# Patient Record
Sex: Female | Born: 1935 | Race: White | Hispanic: No | Smoking: Never smoker
Health system: Southern US, Community
[De-identification: ages and names within clinical notes are randomized; demographics above are authoritative.]

## PROBLEM LIST (undated history)

## (undated) DIAGNOSIS — C801 Malignant (primary) neoplasm, unspecified: Secondary | ICD-10-CM

## (undated) DIAGNOSIS — M1990 Unspecified osteoarthritis, unspecified site: Secondary | ICD-10-CM

## (undated) DIAGNOSIS — J45909 Unspecified asthma, uncomplicated: Secondary | ICD-10-CM

## (undated) DIAGNOSIS — I10 Essential (primary) hypertension: Secondary | ICD-10-CM

## (undated) HISTORY — PX: TONSILLECTOMY AND ADENOIDECTOMY: SUR1326

## (undated) HISTORY — PX: COLONOSCOPY: SHX174

## (undated) HISTORY — PX: APPENDECTOMY: SHX54

---

## 1968-07-07 DIAGNOSIS — C801 Malignant (primary) neoplasm, unspecified: Secondary | ICD-10-CM

## 1968-07-07 HISTORY — DX: Malignant (primary) neoplasm, unspecified: C80.1

## 2005-02-11 ENCOUNTER — Ambulatory Visit: Payer: Self-pay | Admitting: Internal Medicine

## 2006-05-12 ENCOUNTER — Ambulatory Visit: Payer: Self-pay | Admitting: Internal Medicine

## 2007-07-22 ENCOUNTER — Ambulatory Visit: Payer: Self-pay | Admitting: Internal Medicine

## 2007-07-30 ENCOUNTER — Ambulatory Visit: Payer: Self-pay | Admitting: Internal Medicine

## 2008-07-24 ENCOUNTER — Ambulatory Visit: Payer: Self-pay | Admitting: Internal Medicine

## 2009-07-01 ENCOUNTER — Emergency Department: Payer: Self-pay | Admitting: Emergency Medicine

## 2009-08-21 ENCOUNTER — Ambulatory Visit: Payer: Self-pay | Admitting: Internal Medicine

## 2010-08-27 ENCOUNTER — Ambulatory Visit: Payer: Self-pay | Admitting: Internal Medicine

## 2011-10-08 ENCOUNTER — Ambulatory Visit: Payer: Self-pay | Admitting: Internal Medicine

## 2012-11-08 ENCOUNTER — Ambulatory Visit: Payer: Self-pay | Admitting: Internal Medicine

## 2012-11-09 ENCOUNTER — Ambulatory Visit: Payer: Self-pay | Admitting: Internal Medicine

## 2013-01-06 ENCOUNTER — Ambulatory Visit: Payer: Self-pay | Admitting: Orthopedic Surgery

## 2013-06-20 ENCOUNTER — Emergency Department: Payer: Self-pay | Admitting: Emergency Medicine

## 2013-06-20 LAB — URINALYSIS, COMPLETE
Bacteria: NONE SEEN
Blood: NEGATIVE
Glucose,UR: NEGATIVE mg/dL (ref 0–75)
Hyaline Cast: 1
Nitrite: NEGATIVE
RBC,UR: 1 /HPF (ref 0–5)
Specific Gravity: 1.015 (ref 1.003–1.030)

## 2013-06-20 LAB — BASIC METABOLIC PANEL
Anion Gap: 3 — ABNORMAL LOW (ref 7–16)
BUN: 17 mg/dL (ref 7–18)
Chloride: 104 mmol/L (ref 98–107)
Co2: 29 mmol/L (ref 21–32)
Creatinine: 0.76 mg/dL (ref 0.60–1.30)
EGFR (Non-African Amer.): >60
Glucose: 107 mg/dL — ABNORMAL HIGH (ref 65–99)
Sodium: 136 mmol/L (ref 136–145)

## 2013-06-20 LAB — CBC WITH DIFFERENTIAL/PLATELET
Basophil %: 0.7 %
Eosinophil #: 0 10*3/uL (ref 0.0–0.7)
Eosinophil %: 0.2 %
HCT: 44.5 % (ref 35.0–47.0)
Lymphocyte #: 3 10*3/uL (ref 1.0–3.6)
Lymphocyte %: 23.7 %
MCH: 29.8 pg (ref 26.0–34.0)
MCV: 92 fL (ref 80–100)
Monocyte #: 0.5 x10 3/mm (ref 0.2–0.9)
Monocyte %: 4.3 %
Neutrophil #: 8.9 10*3/uL — ABNORMAL HIGH (ref 1.4–6.5)
Neutrophil %: 71.1 %
Platelet: 204 10*3/uL (ref 150–440)
RBC: 4.84 10*6/uL (ref 3.80–5.20)
RDW: 14.1 % (ref 11.5–14.5)
WBC: 12.5 10*3/uL — ABNORMAL HIGH (ref 3.6–11.0)

## 2013-11-11 ENCOUNTER — Ambulatory Visit: Payer: Self-pay | Admitting: Internal Medicine

## 2013-11-21 ENCOUNTER — Ambulatory Visit: Payer: Self-pay | Admitting: Internal Medicine

## 2014-10-12 ENCOUNTER — Ambulatory Visit
Admit: 2014-10-12 | Disposition: A | Discharge status: Home / Self Care | Payer: Self-pay | Attending: Podiatry | Admitting: Podiatry

## 2014-10-12 HISTORY — PX: HAMMER TOE SURGERY: SHX385

## 2014-10-20 ENCOUNTER — Other Ambulatory Visit: Payer: Self-pay | Admitting: Internal Medicine

## 2014-10-20 DIAGNOSIS — Z1231 Encounter for screening mammogram for malignant neoplasm of breast: Secondary | ICD-10-CM

## 2014-10-27 ENCOUNTER — Ambulatory Visit
Admit: 2014-10-27 | Disposition: A | Discharge status: Home / Self Care | Payer: Self-pay | Attending: Podiatry | Admitting: Podiatry

## 2014-10-27 HISTORY — PX: ORIF METATARSAL FRACTURE: SUR942

## 2014-11-13 ENCOUNTER — Other Ambulatory Visit: Payer: Self-pay | Admitting: Internal Medicine

## 2014-11-13 ENCOUNTER — Ambulatory Visit
Admission: RE | Admit: 2014-11-13 | Discharge: 2014-11-13 | Disposition: A | Discharge status: Home / Self Care | Payer: Medicare Other | Source: Ambulatory Visit | Attending: Internal Medicine | Admitting: Internal Medicine

## 2014-11-13 DIAGNOSIS — Z1231 Encounter for screening mammogram for malignant neoplasm of breast: Secondary | ICD-10-CM

## 2014-11-13 HISTORY — DX: Malignant (primary) neoplasm, unspecified: C80.1

## 2015-10-17 ENCOUNTER — Other Ambulatory Visit: Payer: Self-pay | Admitting: Internal Medicine

## 2015-10-17 DIAGNOSIS — Z1231 Encounter for screening mammogram for malignant neoplasm of breast: Secondary | ICD-10-CM

## 2015-11-28 ENCOUNTER — Ambulatory Visit
Admission: RE | Admit: 2015-11-28 | Discharge: 2015-11-28 | Disposition: A | Discharge status: Home / Self Care | Payer: Medicare Other | Source: Ambulatory Visit | Attending: Internal Medicine | Admitting: Internal Medicine

## 2015-11-28 ENCOUNTER — Other Ambulatory Visit: Payer: Self-pay | Admitting: Internal Medicine

## 2015-11-28 DIAGNOSIS — Z1231 Encounter for screening mammogram for malignant neoplasm of breast: Secondary | ICD-10-CM

## 2016-10-20 ENCOUNTER — Other Ambulatory Visit: Payer: Self-pay | Admitting: Internal Medicine

## 2016-10-20 DIAGNOSIS — Z1231 Encounter for screening mammogram for malignant neoplasm of breast: Secondary | ICD-10-CM

## 2016-11-28 ENCOUNTER — Ambulatory Visit
Admission: RE | Admit: 2016-11-28 | Discharge: 2016-11-28 | Disposition: A | Discharge status: Home / Self Care | Payer: Medicare Other | Source: Ambulatory Visit | Attending: Internal Medicine | Admitting: Internal Medicine

## 2016-11-28 DIAGNOSIS — Z1231 Encounter for screening mammogram for malignant neoplasm of breast: Secondary | ICD-10-CM | POA: Insufficient documentation

## 2017-04-02 ENCOUNTER — Encounter: Payer: Self-pay | Admitting: Emergency Medicine

## 2017-04-02 ENCOUNTER — Emergency Department: Payer: Medicare Other

## 2017-04-02 ENCOUNTER — Observation Stay
Admission: EM | Admit: 2017-04-02 | Discharge: 2017-04-03 | Disposition: A | Discharge status: Home / Self Care | Payer: Medicare Other | Attending: Internal Medicine | Admitting: Internal Medicine

## 2017-04-02 ENCOUNTER — Observation Stay
Admit: 2017-04-02 | Discharge: 2017-04-02 | Disposition: A | Discharge status: Home / Self Care | Payer: Medicare Other | Attending: Internal Medicine | Admitting: Internal Medicine

## 2017-04-02 DIAGNOSIS — J45901 Unspecified asthma with (acute) exacerbation: Secondary | ICD-10-CM | POA: Diagnosis not present

## 2017-04-02 DIAGNOSIS — J9601 Acute respiratory failure with hypoxia: Secondary | ICD-10-CM | POA: Diagnosis not present

## 2017-04-02 DIAGNOSIS — R000 Tachycardia, unspecified: Secondary | ICD-10-CM

## 2017-04-02 DIAGNOSIS — Z7982 Long term (current) use of aspirin: Secondary | ICD-10-CM | POA: Diagnosis not present

## 2017-04-02 DIAGNOSIS — R0602 Shortness of breath: Secondary | ICD-10-CM

## 2017-04-02 DIAGNOSIS — Z79899 Other long term (current) drug therapy: Secondary | ICD-10-CM | POA: Diagnosis not present

## 2017-04-02 DIAGNOSIS — R0682 Tachypnea, not elsewhere classified: Secondary | ICD-10-CM | POA: Diagnosis not present

## 2017-04-02 DIAGNOSIS — R062 Wheezing: Secondary | ICD-10-CM

## 2017-04-02 DIAGNOSIS — R079 Chest pain, unspecified: Secondary | ICD-10-CM

## 2017-04-02 DIAGNOSIS — I209 Angina pectoris, unspecified: Principal | ICD-10-CM | POA: Insufficient documentation

## 2017-04-02 DIAGNOSIS — R0902 Hypoxemia: Secondary | ICD-10-CM

## 2017-04-02 LAB — TROPONIN I: Troponin I: <0.03 ng/mL (ref <0.03)

## 2017-04-02 LAB — ECHOCARDIOGRAM COMPLETE
Height: 66 in
WEIGHTICAEL: 2880 [oz_av]

## 2017-04-02 LAB — HEMOGLOBIN A1C
HEMOGLOBIN A1C: 5.9 % — AB (ref 4.8–5.6)
Mean Plasma Glucose: 122.63 mg/dL

## 2017-04-02 LAB — CBC
HCT: 42.5 % (ref 35.0–47.0)
Hemoglobin: 14.3 g/dL (ref 12.0–16.0)
MCH: 30.3 pg (ref 26.0–34.0)
MCHC: 33.7 g/dL (ref 32.0–36.0)
MCV: 90 fL (ref 80.0–100.0)
PLATELETS: 183 10*3/uL (ref 150–440)
RBC: 4.72 MIL/uL (ref 3.80–5.20)
RDW: 13.6 % (ref 11.5–14.5)
WBC: 13.2 10*3/uL — AB (ref 3.6–11.0)

## 2017-04-02 LAB — LIPID PANEL
CHOLESTEROL: 232 mg/dL — AB (ref 0–200)
HDL: 64 mg/dL (ref >40)
LDL Cholesterol: 155 mg/dL — ABNORMAL HIGH (ref 0–99)
TRIGLYCERIDES: 67 mg/dL (ref <150)
Total CHOL/HDL Ratio: 3.6 RATIO
VLDL: 13 mg/dL (ref 0–40)

## 2017-04-02 LAB — BRAIN NATRIURETIC PEPTIDE: B Natriuretic Peptide: 50 pg/mL (ref 0.0–100.0)

## 2017-04-02 LAB — BASIC METABOLIC PANEL
Anion gap: 7 (ref 5–15)
BUN: 13 mg/dL (ref 6–20)
CO2: 29 mmol/L (ref 22–32)
CREATININE: 0.66 mg/dL (ref 0.44–1.00)
Calcium: 9 mg/dL (ref 8.9–10.3)
Chloride: 102 mmol/L (ref 101–111)
GFR calc non Af Amer: >60 mL/min (ref >60)
Glucose, Bld: 128 mg/dL — ABNORMAL HIGH (ref 65–99)
Potassium: 4.6 mmol/L (ref 3.5–5.1)
SODIUM: 138 mmol/L (ref 135–145)

## 2017-04-02 LAB — PROTIME-INR
INR: 0.99
Prothrombin Time: 13 seconds (ref 11.4–15.2)

## 2017-04-02 LAB — APTT

## 2017-04-02 MED ORDER — IBUPROFEN 400 MG PO TABS
400.0000 mg | ORAL_TABLET | Freq: Four times a day (QID) | ORAL | Status: DC | PRN
  Administered 2017-04-02: 400 mg via ORAL
  Filled 2017-04-02: qty 1

## 2017-04-02 MED ORDER — ALPRAZOLAM 0.5 MG PO TABS: 0.2500 mg | ORAL_TABLET | Freq: Every day | ORAL | Status: DC

## 2017-04-02 MED ORDER — IPRATROPIUM-ALBUTEROL 0.5-2.5 (3) MG/3ML IN SOLN
3.0000 mL | Freq: Once | RESPIRATORY_TRACT | Status: AC
  Administered 2017-04-02: 3 mL via RESPIRATORY_TRACT
  Filled 2017-04-02: qty 3

## 2017-04-02 MED ORDER — HEPARIN SODIUM (PORCINE) 5000 UNIT/ML IJ SOLN
5000.0000 [IU] | Freq: Three times a day (TID) | INTRAMUSCULAR | Status: DC
  Administered 2017-04-02 – 2017-04-03 (×3): 5000 [IU] via SUBCUTANEOUS
  Filled 2017-04-02 (×3): qty 1

## 2017-04-02 MED ORDER — DOCUSATE SODIUM 100 MG PO CAPS: 100.0000 mg | ORAL_CAPSULE | Freq: Two times a day (BID) | ORAL | Status: DC | PRN

## 2017-04-02 MED ORDER — ASPIRIN 81 MG PO CHEW
324.0000 mg | CHEWABLE_TABLET | Freq: Once | ORAL | Status: DC
  Filled 2017-04-02: qty 4

## 2017-04-02 MED ORDER — METHYLPREDNISOLONE SODIUM SUCC 125 MG IJ SOLR
125.0000 mg | Freq: Once | INTRAMUSCULAR | Status: AC
  Administered 2017-04-02: 125 mg via INTRAVENOUS
  Filled 2017-04-02: qty 2

## 2017-04-02 MED ORDER — ADULT MULTIVITAMIN W/MINERALS CH
1.0000 | ORAL_TABLET | Freq: Every day | ORAL | Status: DC
  Administered 2017-04-02 – 2017-04-03 (×2): 1 via ORAL
  Filled 2017-04-02 (×2): qty 1

## 2017-04-02 MED ORDER — INFLUENZA VAC SPLIT HIGH-DOSE 0.5 ML IM SUSY
0.5000 mL | PREFILLED_SYRINGE | INTRAMUSCULAR | Status: DC
  Filled 2017-04-02: qty 0.5

## 2017-04-02 MED ORDER — LORATADINE 10 MG PO TABS: 10.0000 mg | ORAL_TABLET | Freq: Every day | ORAL | Status: DC | PRN

## 2017-04-02 MED ORDER — ASPIRIN EC 81 MG PO TBEC
81.0000 mg | DELAYED_RELEASE_TABLET | ORAL | Status: DC
  Administered 2017-04-03: 81 mg via ORAL

## 2017-04-02 MED ORDER — NITROGLYCERIN 0.4 MG SL SUBL: 0.4000 mg | SUBLINGUAL_TABLET | SUBLINGUAL | Status: DC | PRN

## 2017-04-02 MED ORDER — IOPAMIDOL (ISOVUE-370) INJECTION 76%
75.0000 mL | Freq: Once | INTRAVENOUS | Status: AC | PRN
  Administered 2017-04-02: 75 mL via INTRAVENOUS

## 2017-04-02 MED ORDER — IPRATROPIUM-ALBUTEROL 0.5-2.5 (3) MG/3ML IN SOLN
3.0000 mL | Freq: Four times a day (QID) | RESPIRATORY_TRACT | Status: DC | PRN
  Administered 2017-04-02: 3 mL via RESPIRATORY_TRACT
  Filled 2017-04-02: qty 3

## 2017-04-02 MED ORDER — ALPRAZOLAM 0.25 MG PO TABS: 0.2500 mg | ORAL_TABLET | Freq: Every day | ORAL | Status: DC | PRN

## 2017-04-02 MED ORDER — MONTELUKAST SODIUM 10 MG PO TABS
10.0000 mg | ORAL_TABLET | Freq: Every day | ORAL | Status: DC
  Administered 2017-04-02: 10 mg via ORAL
  Filled 2017-04-02: qty 1

## 2017-04-02 MED ORDER — ZOLPIDEM TARTRATE 5 MG PO TABS
5.0000 mg | ORAL_TABLET | Freq: Once | ORAL | Status: AC
  Administered 2017-04-02: 5 mg via ORAL
  Filled 2017-04-02: qty 1

## 2017-04-02 MED ORDER — CALCIUM CARBONATE-VITAMIN D 500-200 MG-UNIT PO TABS
1.0000 | ORAL_TABLET | Freq: Every day | ORAL | Status: DC
  Administered 2017-04-02 – 2017-04-03 (×2): 1 via ORAL
  Filled 2017-04-02 (×2): qty 1

## 2017-04-02 MED ORDER — SODIUM CHLORIDE 0.9 % IV BOLUS (SEPSIS)
500.0000 mL | Freq: Once | INTRAVENOUS | Status: AC
  Administered 2017-04-02: 500 mL via INTRAVENOUS

## 2017-04-02 NOTE — Progress Notes (Signed)
*  PRELIMINARY RESULTS* Echocardiogram 2D Echocardiogram has been performed.  Erika Finley 04/02/2017, 1:12 PM

## 2017-04-02 NOTE — Progress Notes (Signed)
Patient alert and oriented, vss, no complaints of pain, NSR on monitor.  troponins negative x3.  No chest pain during shift.  Continue to monitor.

## 2017-04-02 NOTE — Progress Notes (Signed)
Pt complaining of a headache, no PRN's ordered. Spoke with Dr. Anselm Jungling, he is to put in orders for ibuprofen.

## 2017-04-02 NOTE — ED Triage Notes (Signed)
Pt reports she woke this AM with left sided chest pain that felt "sharp." Pt is SOB in triage. O2 sat 89%, RA. Pt has hx of asthma only. Pt denies N/V.

## 2017-04-02 NOTE — H&P (Signed)
[facility] NAME: Erika Finley    MR#:  [id number]  DATE OF BIRTH:  [birth year]  DATE OF ADMISSION:  04/02/2017  PRIMARY CARE PHYSICIAN: Rusty Aus, MD   REQUESTING/REFERRING PHYSICIAN: norman  CHIEF COMPLAINT:   Chief Complaint  Patient presents with  . Chest Pain    HISTORY OF PRESENT ILLNESS: Erika Finley  is a 81 y.o. female with a known history of Asthma, fairly healthy and active daily life. Was going through her late husbands papers yesterday with her son. In Night she woke up at 1 am with tightness in her chest- though it may be muscle pain and tried ice pack and then Hot pad, but it did not help, pain was retrosternal and pressure like, it was going to her left shoulder and her neck and jaw. She had wheezing - so decided to come to ER. Feels relieved after getting nebs treatment. 1st troponin and EKG is negative. Had some hypoxia on arrival.  PAST MEDICAL HISTORY:   Past Medical History:  Diagnosis Date  . Asthma   . Cancer ([place]) 1970   skin    PAST SURGICAL HISTORY: History reviewed. No pertinent surgical history.  SOCIAL HISTORY:  Social History  Substance Use Topics  . Smoking status: Never Smoker  . Smokeless tobacco: Never Used  . Alcohol use 1.2 oz/week    2 Glasses of wine per week     Comment: 1/4 glass before dinner    FAMILY HISTORY:  Family History  Problem Relation Age of Onset  . Valvular heart disease Brother   . Breast cancer Neg Hx     DRUG ALLERGIES:  Allergies  Allergen Reactions  . Penicillins Rash    Has patient had a PCN reaction causing immediate rash, facial/tongue/throat swelling, SOB or lightheadedness with hypotension: Unknown Has patient had a PCN reaction causing severe rash involving mucus membranes or skin necrosis: Unknown Has patient had a PCN reaction that required hospitalization: Unknown Has patient had a PCN reaction occurring within the last 10 years:  Unknown If all of the above answers are "NO", then may proceed with Cephalosporin use.     REVIEW OF SYSTEMS:   CONSTITUTIONAL: No fever, fatigue or weakness.  EYES: No blurred or double vision.  EARS, NOSE, AND THROAT: No tinnitus or ear pain.  RESPIRATORY: No cough, shortness of breath, wheezing or hemoptysis.  CARDIOVASCULAR: positive for chest pain,no orthopnea, edema.  GASTROINTESTINAL: No nausea, vomiting, diarrhea or abdominal pain.  GENITOURINARY: No dysuria, hematuria.  ENDOCRINE: No polyuria, nocturia,  HEMATOLOGY: No anemia, easy bruising or bleeding SKIN: No rash or lesion. MUSCULOSKELETAL: No joint pain or arthritis.   NEUROLOGIC: No tingling, numbness, weakness.  PSYCHIATRY: No anxiety or depression.   MEDICATIONS AT HOME:  Prior to Admission medications   Medication Sig Start Date End Date Taking? Authorizing Provider  ALPRAZolam (XANAX) 0.25 MG tablet Take 0.25 mg by mouth daily. 03/30/17  Yes [provider]  aspirin EC 81 MG tablet Take 81 mg by mouth 3 (three) times a week.   Yes [provider]  Calcium-Vitamin D 600-200 MG-UNIT tablet Take 1 tablet by mouth daily.   Yes [provider]  fexofenadine (ALLEGRA) 180 MG tablet Take 180 mg by mouth daily. 10/17/16  Yes [provider]  montelukast (SINGULAIR) 10 MG tablet Take 10 mg by mouth at bedtime. 02/27/17  Yes [provider]  Multiple Vitamin (MULTI-VITAMINS) TABS Take 1 tablet by mouth  daily.   Yes [provider]      PHYSICAL EXAMINATION:   VITAL SIGNS: Blood pressure (!) 153/76, pulse (!) 106, temperature 98 F (36.7 C), temperature source Oral, resp. rate (!) 27, height 5\' 6"  (1.676 m), weight 81.6 kg (180 lb), SpO2 96 %.  GENERAL:  81 y.o.-year-old patient lying in the bed with no acute distress.  EYES: Pupils equal, round, reactive to light and accommodation. No scleral icterus. Extraocular muscles intact.  HEENT: Head atraumatic, normocephalic.  Oropharynx and nasopharynx clear.  NECK:  Supple, no jugular venous distention. No thyroid enlargement, no tenderness.  LUNGS: Normal breath sounds bilaterally, no wheezing, rales,rhonchi or crepitation. No use of accessory muscles of respiration.  CARDIOVASCULAR: S1, S2 normal. No murmurs, rubs, or gallops.  ABDOMEN: Soft, nontender, nondistended. Bowel sounds present. No organomegaly or mass.  EXTREMITIES: No pedal edema, cyanosis, or clubbing.  NEUROLOGIC: Cranial nerves II through XII are intact. Muscle strength 5/5 in all extremities. Sensation intact. Gait not checked.  PSYCHIATRIC: The patient is alert and oriented x 3.  SKIN: No obvious rash, lesion, or ulcer.   LABORATORY PANEL:   CBC  Recent Labs Lab 04/02/17 0626  WBC 13.2*  HGB 14.3  HCT 42.5  PLT 183  MCV 90.0  MCH 30.3  MCHC 33.7  RDW 13.6   ------------------------------------------------------------------------------------------------------------------  Chemistries   Recent Labs Lab 04/02/17 0626  NA 138  K 4.6  CL 102  CO2 29  GLUCOSE 128*  BUN 13  CREATININE 0.66  CALCIUM 9.0   ------------------------------------------------------------------------------------------------------------------ estimated creatinine clearance is 61.4 mL/min (by C-G formula based on SCr of 0.66 mg/dL). ------------------------------------------------------------------------------------------------------------------ No results for input(s): TSH, T4TOTAL, T3FREE, THYROIDAB in the last 72 hours.  Invalid input(s): FREET3   Coagulation profile  Recent Labs Lab 04/02/17 0820  INR 0.99   ------------------------------------------------------------------------------------------------------------------- No results for input(s): DDIMER in the last 72 hours. -------------------------------------------------------------------------------------------------------------------  Cardiac Enzymes  Recent Labs Lab  04/02/17 0626  TROPONINI <0.03   ------------------------------------------------------------------------------------------------------------------ Invalid input(s): POCBNP  ---------------------------------------------------------------------------------------------------------------  Urinalysis    Component Value Date/Time   COLORURINE Yellow 06/20/2013 1126   APPEARANCEUR Hazy 06/20/2013 1126   LABSPEC 1.015 06/20/2013 1126   PHURINE 7.0 06/20/2013 1126   GLUCOSEU Negative 06/20/2013 1126   HGBUR Negative 06/20/2013 1126   BILIRUBINUR Negative 06/20/2013 1126   KETONESUR Negative 06/20/2013 1126   PROTEINUR Negative 06/20/2013 1126   NITRITE Negative 06/20/2013 1126   LEUKOCYTESUR Negative 06/20/2013 1126     RADIOLOGY: Dg Chest 2 View  Result Date: 04/02/2017 CLINICAL DATA:  Pt reports she woke this AM with left sided chest pain that felt "sharp." Pain radiates into jaw and throat. Pt is SOB during xray. O2 sat 89%, RA. Pt has hx of asthma only. Pt denies N/V. Never a smoker EXAM: CHEST  2 VIEW COMPARISON:  None. FINDINGS: Moderate lower thoracic spondylosis. Midline trachea. Normal heart size. Atherosclerosis in the transverse aorta. Moderate right hemidiaphragm elevation. No pleural effusion or pneumothorax. Mild left lower lobe or lingular airspace disease, most apparent on the frontal radiograph. IMPRESSION: Left lower local and/or lingular airspace disease. This could represent atelectasis or infection. CT is pending. Aortic Atherosclerosis (ICD10-I70.0). Electronically Signed   By: Abigail Miyamoto M.D.   On: 04/02/2017 07:25   Ct Angio Chest Pe W And/or Wo Contrast  Result Date: 04/02/2017 CLINICAL DATA:  Left side chest pain, shortness of Breath EXAM: CT ANGIOGRAPHY CHEST WITH CONTRAST TECHNIQUE: Multidetector CT imaging of the chest was performed using the standard protocol  during bolus administration of intravenous contrast. Multiplanar CT image reconstructions and MIPs  were obtained to evaluate the vascular anatomy. CONTRAST:  75 cc Isovue 370 IV COMPARISON:  Chest x-ray earlier today FINDINGS: Cardiovascular: Heart is upper limits normal in size. Aorta is normal caliber. Scattered aortic arch calcifications and coronary artery calcifications. No filling defects in the pulmonary arteries to suggest pulmonary emboli. Mediastinum/Nodes: Large hiatal hernia. No mediastinal, hilar, or axillary adenopathy. Lungs/Pleura: Scarring in the apices bilaterally. Linear densities in both lung bases, likely scarring or atelectasis. No effusions. Upper Abdomen: Imaging into the upper abdomen shows no acute findings. Musculoskeletal: Chest wall soft tissues are unremarkable. No acute bony abnormality. Review of the MIP images confirms the above findings. IMPRESSION: No evidence of pulmonary embolus. Scattered coronary artery calcifications. Linear densities in the bases likely reflects scarring or atelectasis. Biapical scarring. Electronically Signed   By: Rolm Baptise M.D.   On: 04/02/2017 08:03    EKG: Orders placed or performed during the hospital encounter of 04/02/17  . EKG 12-Lead  . EKG 12-Lead  . ED EKG within 10 minutes  . ED EKG within 10 minutes    IMPRESSION AND PLAN:  * Anginal chest pain   Telemetry, serial troponin.    Check Lipid panel, HBA1c, Echo.    If any abnormalities, will call cardiology consult later.  * Asthma exacerbation   Duonebs, Currently on 1 ltr oxygen is 97% saturation.  * Ac hypoxic respi failure   Was due to asthma, resolved after getting Nebs treatment.   All the records are reviewed and case discussed with ED provider. Management plans discussed with the patient, family and they are in agreement.  CODE STATUS: Full. Code Status History    This patient does not have a recorded code status. Please follow your organizational policy for patients in this situation.       TOTAL TIME TAKING CARE OF THIS PATIENT: *45 minutes.     Vaughan Basta M.D on 04/02/2017   Between 7am to 6pm - Pager - [phone]  After 6pm go to [email or web address] - password [email or web address] [facility]  Office  [phone]  CC: Primary care physician; Rusty Aus, MD   Note: This dictation was prepared with Dragon dictation along with smaller phrase technology. Any transcriptional errors that result from this process are unintentional.

## 2017-04-02 NOTE — ED Notes (Signed)
Resumed care from [place], [place]. Pt assisted to toilet. NAD noted.

## 2017-04-02 NOTE — Progress Notes (Signed)
Pt requesting something for sleep. MD notified. Orders placed. Will continue to monitor and assess.

## 2017-04-02 NOTE — ED Provider Notes (Signed)
[facility] Emergency Department Provider Note  ____________________________________________  Time seen: Approximately 7:15 AM  I have reviewed the triage vital signs and the nursing notes.   HISTORY  Chief Complaint Chest Pain    HPI Erika Finley is a 81 y.o. female with a history of asthma presenting with chest pain, shortness of breath and hypoxia. The patient reports that around 1:00 in the morning, she woke up and noticed that she was having a sharp pain in the center of the chest with a dull ache in the left shoulder, neck and jaw. This was associated with shortness of breath and wheezing, but no diaphoresis, nausea or vomiting. No lightheadedness or syncope. This persisted throughout the night, so she came in for further evaluation. On arrival to the emergency department, she was noted to be hypoxic to 89%. She denies any recent cough or cold symptoms. She does have seasonal allergies but no acute flare at this time. No lower extremity swelling or calf pain; she has had a recent trip to [place] by car. Last stress test was one year ago and reportedly normal.   Past Medical History:  Diagnosis Date  . Cancer ([place]) 1970   skin    There are no active problems to display for this patient.   History reviewed. No pertinent surgical history.    Allergies Penicillins  Family History  Problem Relation Age of Onset  . Breast cancer Neg Hx     Social History Social History  Substance Use Topics  . Smoking status: Never Smoker  . Smokeless tobacco: Never Used  . Alcohol use Not on file    Review of Systems Constitutional: No fever/chills.no lightheadedness or syncope.no diaphoresis. Eyes: No visual changes.no eye discharge. Itchy eyes. ENT: No sore throat. No congestion or rhinorrhea. Cardiovascular: positivechest pain. Denies palpitations. Respiratory: positiveshortness of breath.  No cough. Gastrointestinal: No abdominal  pain.  No nausea, no vomiting.  No diarrhea.  No constipation. Genitourinary: Negative for dysuria. Musculoskeletal: Negative for back pain.no lower extremity swelling or calf pain. Skin: Negative for rash. Neurological: Negative for headaches. No focal numbness, tingling or weakness.     ____________________________________________   PHYSICAL EXAM:  VITAL SIGNS: ED Triage Vitals  Enc Vitals Group     BP 04/02/17 0622 (!) 160/75     Pulse Rate 04/02/17 0622 (!) 106     Resp 04/02/17 0622 (!) 22     Temp 04/02/17 0622 98 F (36.7 C)     Temp Source 04/02/17 0622 Oral     SpO2 04/02/17 0622 (!) 89 %     Weight 04/02/17 0619 180 lb (81.6 kg)     Height 04/02/17 0619 5\' 6"  (1.676 m)     Head Circumference --      Peak Flow --      Pain Score 04/02/17 0626 7     Pain Loc --      Pain Edu? --      Excl. in [place]? --     Constitutional: Alert and oriented. Well appearing and in no acute distress. Answers questions appropriately. Eyes: Conjunctivae are normal.  EOMI. No scleral icterus. Head: Atraumatic. Nose: No congestion/rhinnorhea. Mouth/Throat: Mucous membranes are moist.  Neck: No stridor.  Supple.  No JVD. No meningismus. Cardiovascular: Fast rate, regular rhythm. No murmurs, rubs or gallops.  Respiratory: the patient is tachypnea with accessory muscle use but no retractions. She is able to speak in 4-5 word sentences. She has end expiratory  wheezing without rales or rhonchi. Gastrointestinal: Obese. Soft, nontender and nondistended.  No guarding or rebound.  No peritoneal signs. Musculoskeletal: No LE edema. No ttp in the calves or palpable cords.  Negative Homan's sign. Neurologic:  A&Ox3.  Speech is clear.  Face and smile are symmetric.  EOMI.  Moves all extremities well. Skin:  Skin is warm, dry and intact. No rash noted. Psychiatric: Mood and affect are normal. Speech and behavior are normal.  Normal judgement.  ____________________________________________    LABS (all labs ordered are listed, but only abnormal results are displayed)  Labs Reviewed  BASIC METABOLIC PANEL - Abnormal; Notable for the following:       Result Value   Glucose, Bld 128 (*)    All other components within normal limits  CBC - Abnormal; Notable for the following:    WBC 13.2 (*)    All other components within normal limits  TROPONIN I  BRAIN NATRIURETIC PEPTIDE  PROTIME-INR  APTT   ____________________________________________  EKG  ED ECG REPORT I, Eula Listen, the attending physician, personally viewed and interpreted this ECG.   Date: 04/02/2017  EKG Time: 613  Rate: 103  Rhythm: sinus tachycardia  Axis: leftward  Intervals:none  ST&T Change: Nonspecific T-wave inversion in V1. No STEMI.  ____________________________________________  RADIOLOGY  Dg Chest 2 View  Result Date: 04/02/2017 CLINICAL DATA:  Pt reports she woke this AM with left sided chest pain that felt "sharp." Pain radiates into jaw and throat. Pt is SOB during xray. O2 sat 89%, RA. Pt has hx of asthma only. Pt denies N/V. Never a smoker EXAM: CHEST  2 VIEW COMPARISON:  None. FINDINGS: Moderate lower thoracic spondylosis. Midline trachea. Normal heart size. Atherosclerosis in the transverse aorta. Moderate right hemidiaphragm elevation. No pleural effusion or pneumothorax. Mild left lower lobe or lingular airspace disease, most apparent on the frontal radiograph. IMPRESSION: Left lower local and/or lingular airspace disease. This could represent atelectasis or infection. CT is pending. Aortic Atherosclerosis (ICD10-I70.0). Electronically Signed   By: Abigail Miyamoto M.D.   On: 04/02/2017 07:25   Ct Angio Chest Pe W And/or Wo Contrast  Result Date: 04/02/2017 CLINICAL DATA:  Left side chest pain, shortness of Breath EXAM: CT ANGIOGRAPHY CHEST WITH CONTRAST TECHNIQUE: Multidetector CT imaging of the chest was performed using the standard protocol during bolus administration of  intravenous contrast. Multiplanar CT image reconstructions and MIPs were obtained to evaluate the vascular anatomy. CONTRAST:  75 cc Isovue 370 IV COMPARISON:  Chest x-ray earlier today FINDINGS: Cardiovascular: Heart is upper limits normal in size. Aorta is normal caliber. Scattered aortic arch calcifications and coronary artery calcifications. No filling defects in the pulmonary arteries to suggest pulmonary emboli. Mediastinum/Nodes: Large hiatal hernia. No mediastinal, hilar, or axillary adenopathy. Lungs/Pleura: Scarring in the apices bilaterally. Linear densities in both lung bases, likely scarring or atelectasis. No effusions. Upper Abdomen: Imaging into the upper abdomen shows no acute findings. Musculoskeletal: Chest wall soft tissues are unremarkable. No acute bony abnormality. Review of the MIP images confirms the above findings. IMPRESSION: No evidence of pulmonary embolus. Scattered coronary artery calcifications. Linear densities in the bases likely reflects scarring or atelectasis. Biapical scarring. Electronically Signed   By: Rolm Baptise M.D.   On: 04/02/2017 08:03    ____________________________________________   PROCEDURES  Procedure(s) performed: None  Procedures  Critical Care performed: No ____________________________________________   INITIAL IMPRESSION / ASSESSMENT AND PLAN / ED COURSE  Pertinent labs & imaging results that were available during my  care of the patient were reviewed by me and considered in my medical decision making (see chart for details).  81 y.o.female with a history of asthma presenting with chest painthat radiates to the neck and jaw, shortness of breath, wheezing, and hypoxia. Overall, the patient does appear to have some difficulty with her breathing at this time. She does maintain oxygen saturations in the mid to high 90s on 2 L nasal cannula. It is possible this is an asthma exacerbation, so treated her with a DuoNeb. We'll also evaluate her for  ACS or MI, but at this time her EKG is reassuring without ischemic changes. I would also consider PE although her pain is not pleuritic I am concerned about this hypoxia. The patient will be admitted for further evaluation and treatment after her emergency department evaluation.  ----------------------------------------- 8:19 AM on 04/02/2017 -----------------------------------------  The patient's workup in the emergency department so far is reassuring. Her troponin is negative and her BNP is within normal limits. She does have a mild elevation of her white blood cell count, but her CT scan does not show focal infiltrate that would be consistent with pneumonia. In addition, she has not been having a cough or fever, so acute pneumonia is much less likely and antibiotics are not indicated at this time. I will treat her with steroids in addition to her duoneb for her wheezing and asthma-like symptoms. I continue to be concerned about her chest pain episode and will admit her to the hospital for further cardiac evaluation and treatment. The patient continues to maintain good oxygen saturations on supplemental O2 and is in agreement with the plan.   ____________________________________________  FINAL CLINICAL IMPRESSION(S) / ED DIAGNOSES  Final diagnoses:  Chest pain, unspecified type  Shortness of breath  Hypoxia  Wheezing         NEW MEDICATIONS STARTED DURING THIS VISIT:  New Prescriptions   No medications on file      Eula Listen, MD 04/02/17 [phone]

## 2017-04-02 NOTE — Progress Notes (Signed)
Patient on RA and saturations are greater than 93%.  Ambulated to bathroom and saturations remain WDL.  Patient does not need physical therapy recommendation.

## 2017-04-02 NOTE — Progress Notes (Signed)
Patient took 324 ASA prior to hospital arrival today.

## 2017-04-02 NOTE — ED Notes (Signed)
Pt O2 sat decreased to 90 after provider removed oxygen. Nurse placed pt back on 2L [place]

## 2017-04-02 NOTE — ED Notes (Signed)
Pt reports taking (4) 81mg  ASA PTA.

## 2017-04-02 NOTE — Care Management Obs Status (Signed)
[facility] NOTIFICATION   Patient Details  Name: Erika Finley MRN: [id number] Date of Birth: 03-17-1936   Medicare Observation Status Notification Given:  Yes    Katrina Stack, RN 04/02/2017, 4:39 PM

## 2017-04-03 DIAGNOSIS — I209 Angina pectoris, unspecified: Secondary | ICD-10-CM | POA: Diagnosis not present

## 2017-04-03 LAB — BASIC METABOLIC PANEL
Anion gap: 9 (ref 5–15)
BUN: 20 mg/dL (ref 6–20)
CALCIUM: 9.5 mg/dL (ref 8.9–10.3)
CO2: 25 mmol/L (ref 22–32)
CREATININE: 0.69 mg/dL (ref 0.44–1.00)
Chloride: 107 mmol/L (ref 101–111)
GFR calc non Af Amer: >60 mL/min (ref >60)
GLUCOSE: 147 mg/dL — AB (ref 65–99)
Potassium: 3.8 mmol/L (ref 3.5–5.1)
Sodium: 141 mmol/L (ref 135–145)

## 2017-04-03 LAB — CBC
HCT: 37.8 % (ref 35.0–47.0)
Hemoglobin: 12.7 g/dL (ref 12.0–16.0)
MCH: 30.4 pg (ref 26.0–34.0)
MCHC: 33.7 g/dL (ref 32.0–36.0)
MCV: 90.2 fL (ref 80.0–100.0)
PLATELETS: 190 10*3/uL (ref 150–440)
RBC: 4.19 MIL/uL (ref 3.80–5.20)
RDW: 13.7 % (ref 11.5–14.5)
WBC: 20 10*3/uL — ABNORMAL HIGH (ref 3.6–11.0)

## 2017-04-03 MED ORDER — ALBUTEROL SULFATE HFA 108 (90 BASE) MCG/ACT IN AERS: 2.0000 | INHALATION_SPRAY | Freq: Four times a day (QID) | RESPIRATORY_TRACT | 2 refills | Status: DC | PRN

## 2017-04-03 NOTE — Discharge Summary (Signed)
[facility] [place] NAME: Erika Finley    MR#:  [id number]  DATE OF BIRTH:  1936-02-24  DATE OF ADMISSION:  04/02/2017 ADMITTING PHYSICIAN: Vaughan Basta, MD  DATE OF DISCHARGE: 04/03/2017   PRIMARY CARE PHYSICIAN: Rusty Aus, MD    ADMISSION DIAGNOSIS:  Shortness of breath [R06.02] Wheezing [R06.2] Sinus tachycardia [R00.0] Hypoxia [R09.02] Chest pain, unspecified type [R07.9]  DISCHARGE DIAGNOSIS:  Principal Problem:   Chest pain   SECONDARY DIAGNOSIS:   Past Medical History:  Diagnosis Date  . Asthma   . Cancer ([place]) 1970   skin    HOSPITAL COURSE:   * Anginal chest pain   Telemetry, serial troponin.    Checked Lipid panel, HBA1c, Echo.     Troponin remained stable.     Echocardiogram without any significant abnormalities.     LDL 155, HBA1c 5.9    No more pain in chest, walked around nurses station without any distress.  * Asthma exacerbation   Duonebs, Currently on room air.  * Ac hypoxic respi failure   Was due to asthma, resolved after getting Nebs treatment.   Stable on room air now.  DISCHARGE CONDITIONS:   Stable.  CONSULTS OBTAINED:    DRUG ALLERGIES:   Allergies  Allergen Reactions  . Penicillins Rash    Has patient had a PCN reaction causing immediate rash, facial/tongue/throat swelling, SOB or lightheadedness with hypotension: Unknown Has patient had a PCN reaction causing severe rash involving mucus membranes or skin necrosis: Unknown Has patient had a PCN reaction that required hospitalization: Unknown Has patient had a PCN reaction occurring within the last 10 years: Unknown If all of the above answers are "NO", then may proceed with Cephalosporin use.     DISCHARGE MEDICATIONS:   Current Discharge Medication List    START taking these medications   Details  albuterol (PROVENTIL HFA;VENTOLIN HFA) 108 (90 Base) MCG/ACT inhaler Inhale 2 puffs into the lungs  every 6 (six) hours as needed for wheezing or shortness of breath. Qty: 1 Inhaler, Refills: 2      CONTINUE these medications which have NOT CHANGED   Details  ALPRAZolam (XANAX) 0.25 MG tablet Take 0.25 mg by mouth daily.    aspirin EC 81 MG tablet Take 81 mg by mouth 3 (three) times a week.    Calcium-Vitamin D 600-200 MG-UNIT tablet Take 1 tablet by mouth daily.    fexofenadine (ALLEGRA) 180 MG tablet Take 180 mg by mouth daily.    montelukast (SINGULAIR) 10 MG tablet Take 10 mg by mouth at bedtime.    Multiple Vitamin (MULTI-VITAMINS) TABS Take 1 tablet by mouth daily.         DISCHARGE INSTRUCTIONS:   Follow with PMD in 1-2 weeks.  If you experience worsening of your admission symptoms, develop shortness of breath, life threatening emergency, suicidal or homicidal thoughts you must seek medical attention immediately by calling 911 or calling your MD immediately  if symptoms less severe.  You Must read complete instructions/literature along with all the possible adverse reactions/side effects for all the Medicines you take and that have been prescribed to you. Take any new Medicines after you have completely understood and accept all the possible adverse reactions/side effects.   Please note  You were cared for by a hospitalist during your hospital stay. If you have any questions about your discharge medications or the care you received while you were in the hospital after you are  discharged, you can call the unit and asked to speak with the hospitalist on call if the hospitalist that took care of you is not available. Once you are discharged, your primary care physician will handle any further medical issues. Please note that NO REFILLS for any discharge medications will be authorized once you are discharged, as it is imperative that you return to your primary care physician (or establish a relationship with a primary care physician if you do not have one) for your aftercare  needs so that they can reassess your need for medications and monitor your lab values.    Today   CHIEF COMPLAINT:   Chief Complaint  Patient presents with  . Chest Pain    HISTORY OF PRESENT ILLNESS:  Erika Finley  is a 81 y.o. female with a known history of Asthma, fairly healthy and active daily life. Was going through her late husbands papers yesterday with her son. In Night she woke up at 1 am with tightness in her chest- though it may be muscle pain and tried ice pack and then Hot pad, but it did not help, pain was retrosternal and pressure like, it was going to her left shoulder and her neck and jaw. She had wheezing - so decided to come to ER. Feels relieved after getting nebs treatment. 1st troponin and EKG is negative. Had some hypoxia on arrival.   VITAL SIGNS:  Blood pressure (!) 148/80, pulse 90, temperature 97.6 F (36.4 C), temperature source Oral, resp. rate 18, height 5\' 6"  (1.676 m), weight 87.7 kg (193 lb 4.8 oz), SpO2 92 %.  I/O:   Intake/Output Summary (Last 24 hours) at 04/03/17 1201 Last data filed at 04/03/17 1145  Gross per 24 hour  Intake              480 ml  Output             2000 ml  Net            -1520 ml    PHYSICAL EXAMINATION:   GENERAL:  81 y.o.-year-old patient lying in the bed with no acute distress.  EYES: Pupils equal, round, reactive to light and accommodation. No scleral icterus. Extraocular muscles intact.  HEENT: Head atraumatic, normocephalic. Oropharynx and nasopharynx clear.  NECK:  Supple, no jugular venous distention. No thyroid enlargement, no tenderness.  LUNGS: Normal breath sounds bilaterally, no wheezing, rales,rhonchi or crepitation. No use of accessory muscles of respiration.  CARDIOVASCULAR: S1, S2 normal. No murmurs, rubs, or gallops.  ABDOMEN: Soft, nontender, nondistended. Bowel sounds present. No organomegaly or mass.  EXTREMITIES: No pedal edema, cyanosis, or clubbing.  NEUROLOGIC: Cranial nerves II through XII  are intact. Muscle strength 5/5 in all extremities. Sensation intact. Gait not checked.  PSYCHIATRIC: The patient is alert and oriented x 3.  SKIN: No obvious rash, lesion, or ulcer.    DATA REVIEW:   CBC  Recent Labs Lab 04/03/17 0319  WBC 20.0*  HGB 12.7  HCT 37.8  PLT 190    Chemistries   Recent Labs Lab 04/03/17 0319  NA 141  K 3.8  CL 107  CO2 25  GLUCOSE 147*  BUN 20  CREATININE 0.69  CALCIUM 9.5    Cardiac Enzymes  Recent Labs Lab 04/02/17 1425  TROPONINI <0.03    Microbiology Results  No results found for this or any previous visit.  RADIOLOGY:  Dg Chest 2 View  Result Date: 04/02/2017 CLINICAL DATA:  Pt reports she woke this  AM with left sided chest pain that felt "sharp." Pain radiates into jaw and throat. Pt is SOB during xray. O2 sat 89%, RA. Pt has hx of asthma only. Pt denies N/V. Never a smoker EXAM: CHEST  2 VIEW COMPARISON:  None. FINDINGS: Moderate lower thoracic spondylosis. Midline trachea. Normal heart size. Atherosclerosis in the transverse aorta. Moderate right hemidiaphragm elevation. No pleural effusion or pneumothorax. Mild left lower lobe or lingular airspace disease, most apparent on the frontal radiograph. IMPRESSION: Left lower local and/or lingular airspace disease. This could represent atelectasis or infection. CT is pending. Aortic Atherosclerosis (ICD10-I70.0). Electronically Signed   By: Abigail Miyamoto M.D.   On: 04/02/2017 07:25   Ct Angio Chest Pe W And/or Wo Contrast  Result Date: 04/02/2017 CLINICAL DATA:  Left side chest pain, shortness of Breath EXAM: CT ANGIOGRAPHY CHEST WITH CONTRAST TECHNIQUE: Multidetector CT imaging of the chest was performed using the standard protocol during bolus administration of intravenous contrast. Multiplanar CT image reconstructions and MIPs were obtained to evaluate the vascular anatomy. CONTRAST:  75 cc Isovue 370 IV COMPARISON:  Chest x-ray earlier today FINDINGS: Cardiovascular: Heart is upper  limits normal in size. Aorta is normal caliber. Scattered aortic arch calcifications and coronary artery calcifications. No filling defects in the pulmonary arteries to suggest pulmonary emboli. Mediastinum/Nodes: Large hiatal hernia. No mediastinal, hilar, or axillary adenopathy. Lungs/Pleura: Scarring in the apices bilaterally. Linear densities in both lung bases, likely scarring or atelectasis. No effusions. Upper Abdomen: Imaging into the upper abdomen shows no acute findings. Musculoskeletal: Chest wall soft tissues are unremarkable. No acute bony abnormality. Review of the MIP images confirms the above findings. IMPRESSION: No evidence of pulmonary embolus. Scattered coronary artery calcifications. Linear densities in the bases likely reflects scarring or atelectasis. Biapical scarring. Electronically Signed   By: Rolm Baptise M.D.   On: 04/02/2017 08:03    EKG:   Orders placed or performed during the hospital encounter of 04/02/17  . EKG 12-Lead  . EKG 12-Lead  . ED EKG within 10 minutes  . ED EKG within 10 minutes      Management plans discussed with the patient, family and they are in agreement.  CODE STATUS:     Code Status Orders        Start     Ordered   04/02/17 1019  Full code  Continuous     04/02/17 1019    Code Status History    Date Active Date Inactive Code Status Order ID Comments User Context   This patient has a current code status but no historical code status.      TOTAL TIME TAKING CARE OF THIS PATIENT: 35 minutes.    Vaughan Basta M.D on 04/03/2017 at 12:01 PM  Between 7am to 6pm - Pager - [phone]  After 6pm go to [email or web address] - password [email or web address] [facility]  Office  [phone]  CC: Primary care physician; Rusty Aus, MD   Note: This dictation was prepared with Dragon dictation along with smaller phrase technology. Any transcriptional errors that result from this process are unintentional.

## 2017-04-25 ENCOUNTER — Emergency Department: Payer: Medicare Other

## 2017-04-25 ENCOUNTER — Inpatient Hospital Stay
Admission: EM | Admit: 2017-04-25 | Discharge: 2017-04-28 | DRG: 202 | Disposition: A | Discharge status: Home / Self Care | Payer: Medicare Other | Attending: Internal Medicine | Admitting: Internal Medicine

## 2017-04-25 ENCOUNTER — Encounter: Payer: Self-pay | Admitting: Emergency Medicine

## 2017-04-25 DIAGNOSIS — T380X5A Adverse effect of glucocorticoids and synthetic analogues, initial encounter: Secondary | ICD-10-CM | POA: Diagnosis not present

## 2017-04-25 DIAGNOSIS — D72829 Elevated white blood cell count, unspecified: Secondary | ICD-10-CM | POA: Diagnosis not present

## 2017-04-25 DIAGNOSIS — R0602 Shortness of breath: Secondary | ICD-10-CM | POA: Diagnosis present

## 2017-04-25 DIAGNOSIS — J9601 Acute respiratory failure with hypoxia: Secondary | ICD-10-CM | POA: Diagnosis present

## 2017-04-25 DIAGNOSIS — Z9109 Other allergy status, other than to drugs and biological substances: Secondary | ICD-10-CM | POA: Diagnosis not present

## 2017-04-25 DIAGNOSIS — J40 Bronchitis, not specified as acute or chronic: Secondary | ICD-10-CM

## 2017-04-25 DIAGNOSIS — J45901 Unspecified asthma with (acute) exacerbation: Principal | ICD-10-CM | POA: Diagnosis present

## 2017-04-25 DIAGNOSIS — Z6829 Body mass index (BMI) 29.0-29.9, adult: Secondary | ICD-10-CM | POA: Diagnosis not present

## 2017-04-25 DIAGNOSIS — Z880 Allergy status to penicillin: Secondary | ICD-10-CM

## 2017-04-25 DIAGNOSIS — I10 Essential (primary) hypertension: Secondary | ICD-10-CM | POA: Diagnosis present

## 2017-04-25 DIAGNOSIS — F430 Acute stress reaction: Secondary | ICD-10-CM | POA: Diagnosis not present

## 2017-04-25 DIAGNOSIS — Z7982 Long term (current) use of aspirin: Secondary | ICD-10-CM

## 2017-04-25 DIAGNOSIS — Z79899 Other long term (current) drug therapy: Secondary | ICD-10-CM | POA: Diagnosis not present

## 2017-04-25 DIAGNOSIS — Z882 Allergy status to sulfonamides status: Secondary | ICD-10-CM

## 2017-04-25 DIAGNOSIS — E663 Overweight: Secondary | ICD-10-CM | POA: Diagnosis present

## 2017-04-25 LAB — CBC WITH DIFFERENTIAL/PLATELET
BASOS PCT: 1 %
Basophils Absolute: 0.1 10*3/uL (ref 0–0.1)
EOS ABS: 0.6 10*3/uL (ref 0–0.7)
Eosinophils Relative: 4 %
HCT: 43.2 % (ref 35.0–47.0)
Hemoglobin: 14.2 g/dL (ref 12.0–16.0)
LYMPHS ABS: 5.1 10*3/uL — AB (ref 1.0–3.6)
Lymphocytes Relative: 32 %
MCH: 30.4 pg (ref 26.0–34.0)
MCHC: 33 g/dL (ref 32.0–36.0)
MCV: 92.1 fL (ref 80.0–100.0)
MONOS PCT: 5 %
Monocytes Absolute: 0.7 10*3/uL (ref 0.2–0.9)
Neutro Abs: 9.4 10*3/uL — ABNORMAL HIGH (ref 1.4–6.5)
Neutrophils Relative %: 58 %
Platelets: 183 10*3/uL (ref 150–440)
RBC: 4.69 MIL/uL (ref 3.80–5.20)
RDW: 13.9 % (ref 11.5–14.5)
WBC: 16 10*3/uL — ABNORMAL HIGH (ref 3.6–11.0)

## 2017-04-25 LAB — BASIC METABOLIC PANEL
Anion gap: 10 (ref 5–15)
BUN: 13 mg/dL (ref 6–20)
CALCIUM: 9.2 mg/dL (ref 8.9–10.3)
CO2: 25 mmol/L (ref 22–32)
CREATININE: 0.86 mg/dL (ref 0.44–1.00)
Chloride: 104 mmol/L (ref 101–111)
GFR calc non Af Amer: >60 mL/min (ref >60)
Glucose, Bld: 147 mg/dL — ABNORMAL HIGH (ref 65–99)
Potassium: 3.9 mmol/L (ref 3.5–5.1)
SODIUM: 139 mmol/L (ref 135–145)

## 2017-04-25 LAB — TROPONIN I

## 2017-04-25 LAB — TSH: TSH: 7.942 u[IU]/mL — ABNORMAL HIGH (ref 0.350–4.500)

## 2017-04-25 MED ORDER — IPRATROPIUM-ALBUTEROL 0.5-2.5 (3) MG/3ML IN SOLN
RESPIRATORY_TRACT | Status: AC
  Administered 2017-04-25: 6 mL via RESPIRATORY_TRACT
  Filled 2017-04-25: qty 6

## 2017-04-25 MED ORDER — DOXYCYCLINE HYCLATE 100 MG PO TABS
100.0000 mg | ORAL_TABLET | Freq: Two times a day (BID) | ORAL | Status: DC
  Administered 2017-04-25 – 2017-04-28 (×7): 100 mg via ORAL
  Filled 2017-04-25 (×7): qty 1

## 2017-04-25 MED ORDER — ASPIRIN EC 81 MG PO TBEC
81.0000 mg | DELAYED_RELEASE_TABLET | ORAL | Status: DC
  Administered 2017-04-27: 81 mg via ORAL
  Filled 2017-04-25: qty 1

## 2017-04-25 MED ORDER — ONDANSETRON HCL 4 MG/2ML IJ SOLN: 4.0000 mg | Freq: Four times a day (QID) | INTRAMUSCULAR | Status: DC | PRN

## 2017-04-25 MED ORDER — ADULT MULTIVITAMIN W/MINERALS CH
1.0000 | ORAL_TABLET | Freq: Every day | ORAL | Status: DC
  Administered 2017-04-25 – 2017-04-28 (×4): 1 via ORAL
  Filled 2017-04-25 (×4): qty 1

## 2017-04-25 MED ORDER — CALCIUM CARBONATE-VITAMIN D 500-200 MG-UNIT PO TABS
1.0000 | ORAL_TABLET | Freq: Every day | ORAL | Status: DC
  Administered 2017-04-25 – 2017-04-28 (×4): 1 via ORAL
  Filled 2017-04-25 (×4): qty 1

## 2017-04-25 MED ORDER — MONTELUKAST SODIUM 10 MG PO TABS
10.0000 mg | ORAL_TABLET | Freq: Every day | ORAL | Status: DC
  Administered 2017-04-25 – 2017-04-27 (×3): 10 mg via ORAL
  Filled 2017-04-25 (×3): qty 1

## 2017-04-25 MED ORDER — METHYLPREDNISOLONE SODIUM SUCC 125 MG IJ SOLR
60.0000 mg | Freq: Four times a day (QID) | INTRAMUSCULAR | Status: DC
  Administered 2017-04-25 – 2017-04-27 (×8): 60 mg via INTRAVENOUS
  Filled 2017-04-25 (×8): qty 2

## 2017-04-25 MED ORDER — ONDANSETRON HCL 4 MG/2ML IJ SOLN
INTRAMUSCULAR | Status: AC
  Administered 2017-04-25: 4 mg via INTRAVENOUS
  Filled 2017-04-25: qty 2

## 2017-04-25 MED ORDER — HYDROCOD POLST-CPM POLST ER 10-8 MG/5ML PO SUER
5.0000 mL | Freq: Two times a day (BID) | ORAL | Status: DC
  Administered 2017-04-25 – 2017-04-28 (×7): 5 mL via ORAL
  Filled 2017-04-25 (×6): qty 5

## 2017-04-25 MED ORDER — ACETAMINOPHEN 650 MG RE SUPP: 650.0000 mg | Freq: Four times a day (QID) | RECTAL | Status: DC | PRN

## 2017-04-25 MED ORDER — PREDNISONE 10 MG PO TABS: 10.0000 mg | ORAL_TABLET | Freq: Every day | ORAL | Status: DC

## 2017-04-25 MED ORDER — PREDNISONE 20 MG PO TABS: 30.0000 mg | ORAL_TABLET | Freq: Every day | ORAL | Status: DC

## 2017-04-25 MED ORDER — ENOXAPARIN SODIUM 40 MG/0.4ML ~~LOC~~ SOLN
40.0000 mg | SUBCUTANEOUS | Status: DC
  Administered 2017-04-25 – 2017-04-27 (×3): 40 mg via SUBCUTANEOUS
  Filled 2017-04-25 (×3): qty 0.4

## 2017-04-25 MED ORDER — ACETAMINOPHEN 325 MG PO TABS: 650.0000 mg | ORAL_TABLET | Freq: Four times a day (QID) | ORAL | Status: DC | PRN

## 2017-04-25 MED ORDER — METHYLPREDNISOLONE SODIUM SUCC 125 MG IJ SOLR: 60.0000 mg | Freq: Every day | INTRAMUSCULAR | Status: DC

## 2017-04-25 MED ORDER — IPRATROPIUM-ALBUTEROL 0.5-2.5 (3) MG/3ML IN SOLN
RESPIRATORY_TRACT | Status: AC
  Filled 2017-04-25: qty 3

## 2017-04-25 MED ORDER — LORATADINE 10 MG PO TABS
10.0000 mg | ORAL_TABLET | Freq: Every day | ORAL | Status: DC
  Administered 2017-04-25 – 2017-04-28 (×4): 10 mg via ORAL
  Filled 2017-04-25 (×4): qty 1

## 2017-04-25 MED ORDER — BENZONATATE 100 MG PO CAPS
100.0000 mg | ORAL_CAPSULE | Freq: Three times a day (TID) | ORAL | Status: DC
  Administered 2017-04-25 – 2017-04-28 (×8): 100 mg via ORAL
  Filled 2017-04-25 (×8): qty 1

## 2017-04-25 MED ORDER — SODIUM CHLORIDE 0.9 % IV BOLUS (SEPSIS)
1000.0000 mL | Freq: Once | INTRAVENOUS | Status: AC
  Administered 2017-04-25: 1000 mL via INTRAVENOUS

## 2017-04-25 MED ORDER — PREDNISONE 20 MG PO TABS: 20.0000 mg | ORAL_TABLET | Freq: Every day | ORAL | Status: DC

## 2017-04-25 MED ORDER — PREDNISONE 10 MG PO TABS: 5.0000 mg | ORAL_TABLET | Freq: Every day | ORAL | Status: DC

## 2017-04-25 MED ORDER — DOCUSATE SODIUM 100 MG PO CAPS
100.0000 mg | ORAL_CAPSULE | Freq: Two times a day (BID) | ORAL | Status: DC
  Administered 2017-04-25 – 2017-04-28 (×7): 100 mg via ORAL
  Filled 2017-04-25 (×7): qty 1

## 2017-04-25 MED ORDER — ALBUTEROL SULFATE (2.5 MG/3ML) 0.083% IN NEBU: 2.5000 mg | INHALATION_SOLUTION | RESPIRATORY_TRACT | Status: DC | PRN

## 2017-04-25 MED ORDER — METHYLPREDNISOLONE SODIUM SUCC 125 MG IJ SOLR
125.0000 mg | Freq: Once | INTRAMUSCULAR | Status: AC
  Administered 2017-04-25: 125 mg via INTRAVENOUS

## 2017-04-25 MED ORDER — ALPRAZOLAM 0.5 MG PO TABS
0.2500 mg | ORAL_TABLET | Freq: Every day | ORAL | Status: DC | PRN
  Administered 2017-04-26 – 2017-04-27 (×2): 0.25 mg via ORAL
  Filled 2017-04-25 (×2): qty 1

## 2017-04-25 MED ORDER — ALBUTEROL SULFATE (2.5 MG/3ML) 0.083% IN NEBU
5.0000 mg | INHALATION_SOLUTION | Freq: Once | RESPIRATORY_TRACT | Status: DC
  Filled 2017-04-25: qty 6

## 2017-04-25 MED ORDER — IPRATROPIUM-ALBUTEROL 0.5-2.5 (3) MG/3ML IN SOLN
3.0000 mL | Freq: Four times a day (QID) | RESPIRATORY_TRACT | Status: DC
  Administered 2017-04-25 – 2017-04-28 (×12): 3 mL via RESPIRATORY_TRACT
  Filled 2017-04-25 (×13): qty 3

## 2017-04-25 MED ORDER — ONDANSETRON HCL 4 MG/2ML IJ SOLN
4.0000 mg | Freq: Once | INTRAMUSCULAR | Status: AC
  Administered 2017-04-25: 4 mg via INTRAVENOUS

## 2017-04-25 MED ORDER — ONDANSETRON HCL 4 MG PO TABS: 4.0000 mg | ORAL_TABLET | Freq: Four times a day (QID) | ORAL | Status: DC | PRN

## 2017-04-25 MED ORDER — PREDNISONE 20 MG PO TABS: 40.0000 mg | ORAL_TABLET | Freq: Every day | ORAL | Status: DC

## 2017-04-25 MED ORDER — PREDNISONE 20 MG PO TABS: 50.0000 mg | ORAL_TABLET | Freq: Every day | ORAL | Status: DC

## 2017-04-25 MED ORDER — IPRATROPIUM-ALBUTEROL 0.5-2.5 (3) MG/3ML IN SOLN
3.0000 mL | Freq: Once | RESPIRATORY_TRACT | Status: AC
  Administered 2017-04-25: 3 mL via RESPIRATORY_TRACT

## 2017-04-25 MED ORDER — MOMETASONE FURO-FORMOTEROL FUM 200-5 MCG/ACT IN AERO
2.0000 | INHALATION_SPRAY | Freq: Two times a day (BID) | RESPIRATORY_TRACT | Status: DC
  Administered 2017-04-25 – 2017-04-28 (×7): 2 via RESPIRATORY_TRACT
  Filled 2017-04-25: qty 8.8

## 2017-04-25 MED ORDER — IPRATROPIUM-ALBUTEROL 0.5-2.5 (3) MG/3ML IN SOLN
6.0000 mL | Freq: Once | RESPIRATORY_TRACT | Status: AC
  Administered 2017-04-25: 6 mL via RESPIRATORY_TRACT

## 2017-04-25 MED ORDER — METHYLPREDNISOLONE SODIUM SUCC 125 MG IJ SOLR
INTRAMUSCULAR | Status: AC
  Administered 2017-04-25: 125 mg via INTRAVENOUS
  Filled 2017-04-25: qty 2

## 2017-04-25 NOTE — Progress Notes (Signed)
[facility] [place] NAME: Erika Finley    MR#:  [id number]  DATE OF BIRTH:  Mar 27, 1936  SUBJECTIVE:  CHIEF COMPLAINT:   Chief Complaint  Patient presents with  . Shortness of Breath   - significant wheezing cough and difficulty breathing. Currently requiring 2-3 L oxygen. -Not on any maintenance inhalers and no history of asthma as outpatient  REVIEW OF SYSTEMS:  Review of Systems  Constitutional: Negative for chills, fever and malaise/fatigue.  HENT: Negative for congestion, ear discharge, hearing loss, nosebleeds and sinus pain.   Eyes: Negative for blurred vision and double vision.  Respiratory: Positive for cough, shortness of breath and wheezing.   Cardiovascular: Negative for chest pain, palpitations and leg swelling.  Gastrointestinal: Negative for abdominal pain, constipation, diarrhea, nausea and vomiting.  Genitourinary: Negative for dysuria and urgency.  Musculoskeletal: Negative for myalgias and neck pain.  Neurological: Negative for dizziness, sensory change, speech change, focal weakness, seizures and headaches.  Psychiatric/Behavioral: Negative for depression.    DRUG ALLERGIES:   Allergies  Allergen Reactions  . Sulfa Antibiotics Nausea Only  . Penicillins Rash    Has patient had a PCN reaction causing immediate rash, facial/tongue/throat swelling, SOB or lightheadedness with hypotension: Unknown Has patient had a PCN reaction causing severe rash involving mucus membranes or skin necrosis: Unknown Has patient had a PCN reaction that required hospitalization: Unknown Has patient had a PCN reaction occurring within the last 10 years: Unknown If all of the above answers are "NO", then may proceed with Cephalosporin use.     VITALS:  Blood pressure (!) 145/66, pulse (!) 107, temperature 98.4 F (36.9 C), temperature source Oral, resp. rate (!) 24, height 5\' 5"  (1.651 m), weight 80.7 kg (178 lb), SpO2 91  %.  PHYSICAL EXAMINATION:  Physical Exam  GENERAL:  81 y.o.-year-old patient lying in the bed with no acute distress.  EYES: Pupils equal, round, reactive to light and accommodation. No scleral icterus. Extraocular muscles intact.  HEENT: Head atraumatic, normocephalic. Oropharynx and nasopharynx clear.  NECK:  Supple, no jugular venous distention. No thyroid enlargement, no tenderness.  LUNGS: diffuse scattered expiratory wheezing all over lung fields, moving air bilaterally. No rales,rhonchi or crepitation. No use of accessory muscles of respiration.  CARDIOVASCULAR: S1, S2 normal. No murmurs, rubs, or gallops.  ABDOMEN: Soft, nontender, nondistended. Bowel sounds present. No organomegaly or mass.  EXTREMITIES: No pedal edema, cyanosis, or clubbing.  NEUROLOGIC: Cranial nerves II through XII are intact. Muscle strength 5/5 in all extremities. Sensation intact. Gait not checked.  PSYCHIATRIC: The patient is alert and oriented x 3.  SKIN: No obvious rash, lesion, or ulcer.    LABORATORY PANEL:   CBC  Recent Labs Lab 04/25/17 0035  WBC 16.0*  HGB 14.2  HCT 43.2  PLT 183   ------------------------------------------------------------------------------------------------------------------  Chemistries   Recent Labs Lab 04/25/17 0035  NA 139  K 3.9  CL 104  CO2 25  GLUCOSE 147*  BUN 13  CREATININE 0.86  CALCIUM 9.2   ------------------------------------------------------------------------------------------------------------------  Cardiac Enzymes  Recent Labs Lab 04/25/17 0035  TROPONINI <0.03   ------------------------------------------------------------------------------------------------------------------  RADIOLOGY:  Dg Chest Port 1 View  Result Date: 04/25/2017 CLINICAL DATA:  81 y/o  F; audible wheeze. EXAM: PORTABLE CHEST 1 VIEW COMPARISON:  04/02/2017 CT and radiograph of the chest. FINDINGS: Stable heart size and mediastinal contours are within normal  limits. Both lungs are clear. The visualized skeletal structures are unremarkable. IMPRESSION: No acute pulmonary  process identified. Electronically Signed   By: Kristine Garbe M.D.   On: 04/25/2017 01:23    EKG:   Orders placed or performed during the hospital encounter of 04/25/17  . EKG 12-Lead  . EKG 12-Lead  . ED EKG  . ED EKG    ASSESSMENT AND PLAN:   81 year old female with no significant past medical history presents to hospital secondary to worsening shortness of breath and cough.  #1 acute reactive airway disease with bronchitis-had a similar episode last month for which she was treated acutely with inhalers and antibiotics. -she was treated with Advair and albuterol transiently a few years ago for reactive airway disease. -Dust exposure precipitated the last episode, none this episode. -added cough medications, increase the dose of steroids -agree with antibiotics-add doxycycline -start Pulmicort nebs if needed -chest x-ray is normal. Echocardiogram done 3 weeks ago shows normal EF.  #2 allergies- will need to follow up with allergist as outpatient -Continue Singulair and Claritin  #3 Leukocytosis-acute stress reaction, follow-up.  #4 DVT prophylaxis-Lovenox     All the records are reviewed and case discussed with Care Management/Social Workerr. Management plans discussed with the patient, family and they are in agreement.  CODE STATUS: Full Code  TOTAL TIME TAKING CARE OF THIS PATIENT: 37 minutes.   POSSIBLE D/C IN 2 DAYS, DEPENDING ON CLINICAL CONDITION.   Kylan Veach M.D on 04/25/2017 at 1:36 PM  Between 7am to 6pm - Pager - [phone]  After 6pm go to [email or web address] - password [email or web address] [facility]  Office  [phone]  CC: Primary care physician; Rusty Aus, MD

## 2017-04-25 NOTE — ED Notes (Signed)
Pt. Placed on 2L of 02 due low 90's O2 sat.

## 2017-04-25 NOTE — ED Notes (Addendum)
Pt. States over the past couple months she has been "winded after excurtion".  Pt. Son states its been going on for years.

## 2017-04-25 NOTE — ED Triage Notes (Signed)
Pt arrives POC to ED with c/o [facility] which started at 1930 this evening. Pt has audible wheezing at this time and is diaphoretic.

## 2017-04-25 NOTE — ED Provider Notes (Signed)
[facility] Emergency Department Provider Note    First MD Initiated Contact with Patient 04/25/17 [phone]     (approximate)  I have reviewed the triage vital signs and the nursing notes.   HISTORY  Chief Complaint Shortness of Breath    HPI Erika Finley is a 81 y.o. female with below list of chronic medical conditions presents to the emergency department acute onset of respiratory difficulty nonproductive cough shortness of breath which began at 7:30 PM tonight. Patient denies any fever or febrile on presentation temperature 97.6.   Past Medical History:  Diagnosis Date  . Asthma   . Cancer [facility]) 1970   skin    Patient Active Problem List   Diagnosis Date Noted  . Asthma exacerbation 04/25/2017  . Chest pain 04/02/2017    Past surgical history None  Prior to Admission medications   Medication Sig Start Date End Date Taking? Authorizing Provider  albuterol (PROVENTIL HFA;VENTOLIN HFA) 108 (90 Base) MCG/ACT inhaler Inhale 2 puffs into the lungs every 6 (six) hours as needed for wheezing or shortness of breath. 04/03/17  Yes Vaughan Basta, MD  ALPRAZolam Duanne Moron) 0.25 MG tablet Take 0.25 mg by mouth daily as needed for anxiety.  03/30/17  Yes [provider]  aspirin EC 81 MG tablet Take 81 mg by mouth 3 (three) times a week.   Yes [provider]  Calcium-Vitamin D 600-200 MG-UNIT tablet Take 1 tablet by mouth daily.   Yes [provider]  fexofenadine (ALLEGRA) 180 MG tablet Take 180 mg by mouth daily. 10/17/16  Yes [provider]  montelukast (SINGULAIR) 10 MG tablet Take 10 mg by mouth at bedtime. 02/27/17  Yes [provider]  Multiple Vitamin (MULTI-VITAMINS) TABS Take 1 tablet by mouth daily.   Yes [provider]    Allergies Sulfa antibiotics and Penicillins  Family History  Problem Relation Age of Onset  . Valvular heart disease Brother   . Breast cancer Neg Hx      Social History Social History  Substance Use Topics  . Smoking status: Never Smoker  . Smokeless tobacco: Never Used  . Alcohol use 1.2 oz/week    2 Glasses of wine per week     Comment: 1/4 glass before dinner    Review of Systems Constitutional: No fever/chills Eyes: No visual changes. ENT: No sore throat. Cardiovascular: Denies chest pain. Respiratory: positive for dyspnea and nonproductive cough Gastrointestinal: No abdominal pain.  No nausea, no vomiting.  No diarrhea.  No constipation. Genitourinary: Negative for dysuria. Musculoskeletal: Negative for neck pain.  Negative for back pain. Integumentary: Negative for rash. Neurological: Negative for headaches, focal weakness or numbness.   ____________________________________________   PHYSICAL EXAM:  VITAL SIGNS: ED Triage Vitals  Enc Vitals Group     BP 04/25/17 0028 (!) 168/96     Pulse Rate 04/25/17 0028 (!) 113     Resp 04/25/17 0028 (!) 24     Temp 04/25/17 0028 97.6 F (36.4 C)     Temp Source 04/25/17 0028 Axillary     SpO2 04/25/17 0028 90 %     Weight 04/25/17 0030 80.7 kg (178 lb)     Height 04/25/17 0030 1.651 m (5\' 5" )     Head Circumference --      Peak Flow --      Pain Score 04/25/17 0033 0     Pain Loc --      Pain Edu? --  Excl. in [place]? --     Constitutional: Alert and oriented.apparent respiratory distress Eyes: Conjunctivae are normal.  Head: Atraumatic. Mouth/Throat: Mucous membranes are moist. Oropharynx non-erythematous. Neck: No stridor.   Cardiovascular: Normal rate, regular rhythm. Good peripheral circulation. Grossly normal heart sounds. Respiratory: tachypnea, positive accessory respiratory muscle use, diffuse wheezes noted Gastrointestinal: Soft and nontender. No distention.  Musculoskeletal: No lower extremity tenderness nor edema. No gross deformities of extremities. Neurologic:  Normal speech and language. No gross focal neurologic deficits are appreciated.  Skin:   Skin is warm, dry and intact. No rash noted. Psychiatric: Mood and affect are normal. Speech and behavior are normal.  ____________________________________________   LABS (all labs ordered are listed, but only abnormal results are displayed)  Labs Reviewed  CBC WITH DIFFERENTIAL/PLATELET - Abnormal; Notable for the following:       Result Value   WBC 16.0 (*)    Neutro Abs 9.4 (*)    Lymphs Abs 5.1 (*)    All other components within normal limits  BASIC METABOLIC PANEL - Abnormal; Notable for the following:    Glucose, Bld 147 (*)    All other components within normal limits  TSH - Abnormal; Notable for the following:    TSH 7.942 (*)    All other components within normal limits  TROPONIN I   ____________________________________________  EKG  ED ECG REPORT I, [place] N BROWN, the attending physician, personally viewed and interpreted this ECG.   Date: 04/25/2017  EKG Time: 12:27 AM  Rate: 116  Rhythm: sinus tachycardia  Axis: normal  Intervals:normal  ST&T Change: none  ____________________________________________  RADIOLOGY I, [place] Ernst Bowler, personally viewed and evaluated these images (plain radiographs) as part of my medical decision making, as well as reviewing the written report by the radiologist.  Dg Chest Port 1 View  Result Date: 04/25/2017 CLINICAL DATA:  81 y/o  F; audible wheeze. EXAM: PORTABLE CHEST 1 VIEW COMPARISON:  04/02/2017 CT and radiograph of the chest. FINDINGS: Stable heart size and mediastinal contours are within normal limits. Both lungs are clear. The visualized skeletal structures are unremarkable. IMPRESSION: No acute pulmonary process identified. Electronically Signed   By: Kristine Garbe M.D.   On: 04/25/2017 01:23    ____________________________________________   PROCEDURES  Critical Care performed: CRITICAL CARE Performed by: Gregor Hams   Total critical care time: 45 minutes  Critical care time was  exclusive of separately billable procedures and treating other patients.  Critical care was necessary to treat or prevent imminent or life-threatening deterioration.  Critical care was time spent personally by me on the following activities: development of treatment plan with patient and/or surrogate as well as nursing, discussions with consultants, evaluation of patient's response to treatment, examination of patient, obtaining history from patient or surrogate, ordering and performing treatments and interventions, ordering and review of laboratory studies, ordering and review of radiographic studies, pulse oximetry and re-evaluation of patient's condition.  Procedures   ____________________________________________   INITIAL IMPRESSION / ASSESSMENT AND PLAN / ED COURSE  As part of my medical decision making, I reviewed the following data within the electronic MEDICAL RECORD NUMBER 81 year old female presenting with above stated history of physical exam consistent respiratory distress. Patient given DuoNeb 3 IV Solu-Medrol 125 mg with continued apparent respiratory distress and a such BiPAP applied. Following application of BiPAP patient with improved work of breathing and oxygen saturation. Suspect asthma versus pneumonia as etiology for the patient's respiratory distress as such chest x-ray was performed  which revealed no evidence of pneumonia. Patient discussed with Dr. Hedda Slade admission for further evaluation and management. ____________________________________________  FINAL CLINICAL IMPRESSION(S) / ED DIAGNOSES  acute respiratory failure  MEDICATIONS GIVEN DURING THIS VISIT:  Medications  montelukast (SINGULAIR) tablet 10 mg (not administered)  ALPRAZolam (XANAX) tablet 0.25 mg (not administered)  aspirin EC tablet 81 mg (not administered)  loratadine (CLARITIN) tablet 10 mg (not administered)  multivitamin with minerals tablet 1 tablet (not administered)  calcium-vitamin D  (OSCAL WITH D) 500-200 MG-UNIT per tablet 1 tablet (not administered)  ipratropium-albuterol (DUONEB) 0.5-2.5 (3) MG/3ML nebulizer solution 3 mL (3 mLs Nebulization Given 04/25/17 0636)  albuterol (PROVENTIL) (2.5 MG/3ML) 0.083% nebulizer solution 2.5 mg (not administered)  methylPREDNISolone sodium succinate (SOLU-MEDROL) 125 mg/2 mL injection 60 mg (not administered)  enoxaparin (LOVENOX) injection 40 mg (not administered)  acetaminophen (TYLENOL) tablet 650 mg (not administered)    Or  acetaminophen (TYLENOL) suppository 650 mg (not administered)  docusate sodium (COLACE) capsule 100 mg (not administered)  ondansetron (ZOFRAN) tablet 4 mg (not administered)    Or  ondansetron (ZOFRAN) injection 4 mg (not administered)  mometasone-formoterol (DULERA) 200-5 MCG/ACT inhaler 2 puff (not administered)  predniSONE (DELTASONE) tablet 50 mg (not administered)    Followed by  predniSONE (DELTASONE) tablet 40 mg (not administered)    Followed by  predniSONE (DELTASONE) tablet 30 mg (not administered)    Followed by  predniSONE (DELTASONE) tablet 20 mg (not administered)    Followed by  predniSONE (DELTASONE) tablet 10 mg (not administered)    Followed by  predniSONE (DELTASONE) tablet 5 mg (not administered)  ipratropium-albuterol (DUONEB) 0.5-2.5 (3) MG/3ML nebulizer solution 3 mL ( Nebulization Not Given 04/25/17 0045)  ipratropium-albuterol (DUONEB) 0.5-2.5 (3) MG/3ML nebulizer solution 6 mL (6 mLs Nebulization Given 04/25/17 0057)  methylPREDNISolone sodium succinate (SOLU-MEDROL) 125 mg/2 mL injection 125 mg (125 mg Intravenous Given 04/25/17 0058)  ondansetron (ZOFRAN) injection 4 mg (4 mg Intravenous Given 04/25/17 0207)  sodium chloride 0.9 % bolus 1,000 mL (1,000 mLs Intravenous New Bag/Given 04/25/17 0458)     NEW OUTPATIENT MEDICATIONS STARTED DURING THIS VISIT:  Current Discharge Medication List      Current Discharge Medication List      Current Discharge Medication  List       Note:  This document was prepared using Dragon voice recognition software and may include unintentional dictation errors.    Gregor Hams, MD 04/25/17 [phone]

## 2017-04-25 NOTE — H&P (Signed)
Erika Finley is an 81 y.o. female.   Chief Complaint: shortness of breath HPI: the patient with past medical history of asthma presents emergency department due to shortness of breath and wheezing. The patient states that she was in her usual state of health this evening when she acutely began to cough and feel as if she could not catch her breath. She used her albuterol inhaler twice with no relief. She denies chest pain, nausea or vomiting. She also denies fevers. Her symptoms began after handling items that had been in the attic. In the emergency department she was found to be hypoxic as well as using accessory muscles which prompted initiation of BiPAP. She was given Solu-Medrol 125 mg as well as breathing treatments prior to the emergency department staff called the hospitalist service for admission.  Past Medical History:  Diagnosis Date  . Asthma   . Cancer [facility]) 1970   skin    Past Surgical History:  Procedure Laterality Date  . APPENDECTOMY    . TONSILLECTOMY AND ADENOIDECTOMY      Family History  Problem Relation Age of Onset  . Valvular heart disease Brother   . Breast cancer Neg Hx    Social History:  reports that she has never smoked. She has never used smokeless tobacco. She reports that she drinks about 1.2 oz of alcohol per week . She reports that she does not use drugs.  Allergies:  Allergies  Allergen Reactions  . Sulfa Antibiotics Nausea Only  . Penicillins Rash    Has patient had a PCN reaction causing immediate rash, facial/tongue/throat swelling, SOB or lightheadedness with hypotension: Unknown Has patient had a PCN reaction causing severe rash involving mucus membranes or skin necrosis: Unknown Has patient had a PCN reaction that required hospitalization: Unknown Has patient had a PCN reaction occurring within the last 10 years: Unknown If all of the above answers are "NO", then may proceed with Cephalosporin use.     Medications Prior to Admission   Medication Sig Dispense Refill  . albuterol (PROVENTIL HFA;VENTOLIN HFA) 108 (90 Base) MCG/ACT inhaler Inhale 2 puffs into the lungs every 6 (six) hours as needed for wheezing or shortness of breath. 1 Inhaler 2  . ALPRAZolam (XANAX) 0.25 MG tablet Take 0.25 mg by mouth daily as needed for anxiety.     Marland Kitchen aspirin EC 81 MG tablet Take 81 mg by mouth 3 (three) times a week.    . Calcium-Vitamin D 600-200 MG-UNIT tablet Take 1 tablet by mouth daily.    . fexofenadine (ALLEGRA) 180 MG tablet Take 180 mg by mouth daily.    . montelukast (SINGULAIR) 10 MG tablet Take 10 mg by mouth at bedtime.    . Multiple Vitamin (MULTI-VITAMINS) TABS Take 1 tablet by mouth daily.      Results for orders placed or performed during the hospital encounter of 04/25/17 (from the past 48 hour(s))  CBC with Differential     Status: Abnormal   Collection Time: 04/25/17 12:35 AM  Result Value Ref Range   WBC 16.0 (H) 3.6 - 11.0 K/uL   RBC 4.69 3.80 - 5.20 MIL/uL   Hemoglobin 14.2 12.0 - 16.0 g/dL   HCT 43.2 35.0 - 47.0 %   MCV 92.1 80.0 - 100.0 fL   MCH 30.4 26.0 - 34.0 pg   MCHC 33.0 32.0 - 36.0 g/dL   RDW 13.9 11.5 - 14.5 %   Platelets 183 150 - 440 K/uL   Neutrophils Relative % 58 %  Neutro Abs 9.4 (H) 1.4 - 6.5 K/uL   Lymphocytes Relative 32 %   Lymphs Abs 5.1 (H) 1.0 - 3.6 K/uL   Monocytes Relative 5 %   Monocytes Absolute 0.7 0.2 - 0.9 K/uL   Eosinophils Relative 4 %   Eosinophils Absolute 0.6 0 - 0.7 K/uL   Basophils Relative 1 %   Basophils Absolute 0.1 0 - 0.1 K/uL  Basic metabolic panel     Status: Abnormal   Collection Time: 04/25/17 12:35 AM  Result Value Ref Range   Sodium 139 135 - 145 mmol/L   Potassium 3.9 3.5 - 5.1 mmol/L   Chloride 104 101 - 111 mmol/L   CO2 25 22 - 32 mmol/L   Glucose, Bld 147 (H) 65 - 99 mg/dL   BUN 13 6 - 20 mg/dL   Creatinine, Ser 0.86 0.44 - 1.00 mg/dL   Calcium 9.2 8.9 - 10.3 mg/dL   GFR calc non Af Amer >60 >60 mL/min   GFR calc Af Amer >60 >60 mL/min     Comment: (NOTE) The eGFR has been calculated using the CKD EPI equation. This calculation has not been validated in all clinical situations. eGFR's persistently <60 mL/min signify possible Chronic Kidney Disease.    Anion gap 10 5 - 15  Troponin I     Status: None   Collection Time: 04/25/17 12:35 AM  Result Value Ref Range   Troponin I <0.03 <0.03 ng/mL  TSH     Status: Abnormal   Collection Time: 04/25/17 12:35 AM  Result Value Ref Range   TSH 7.942 (H) 0.350 - 4.500 uIU/mL    Comment: Performed by a 3rd Generation assay with a functional sensitivity of <=0.01 uIU/mL.   Dg Chest Port 1 View  Result Date: 04/25/2017 CLINICAL DATA:  81 y/o  F; audible wheeze. EXAM: PORTABLE CHEST 1 VIEW COMPARISON:  04/02/2017 CT and radiograph of the chest. FINDINGS: Stable heart size and mediastinal contours are within normal limits. Both lungs are clear. The visualized skeletal structures are unremarkable. IMPRESSION: No acute pulmonary process identified. Electronically Signed   By: Kristine Garbe M.D.   On: 04/25/2017 01:23    Review of Systems  Constitutional: Negative for chills and fever.  HENT: Negative for sore throat and tinnitus.   Eyes: Negative for blurred vision and redness.  Respiratory: Positive for shortness of breath and wheezing. Negative for cough.   Cardiovascular: Negative for chest pain, palpitations, orthopnea and PND.  Gastrointestinal: Negative for abdominal pain, diarrhea, nausea and vomiting.  Genitourinary: Negative for dysuria, frequency and urgency.  Musculoskeletal: Negative for joint pain and myalgias.  Skin: Negative for rash.       No lesions  Neurological: Negative for speech change, focal weakness and weakness.  Endo/Heme/Allergies: Does not bruise/bleed easily.       No temperature intolerance  Psychiatric/Behavioral: Negative for depression and suicidal ideas.    Blood pressure (!) 145/66, pulse (!) 107, temperature 98.4 F (36.9 C),  temperature source Oral, resp. rate (!) 24, height _0  (1.651 m), weight 80.7 kg (178 lb), SpO2 91 %. Physical Exam  Vitals reviewed. Constitutional: She is oriented to person, place, and time. She appears well-developed and well-nourished. No distress.  HENT:  Head: Normocephalic and atraumatic.  Mouth/Throat: Oropharynx is clear and moist.  Eyes: Pupils are equal, round, and reactive to light. Conjunctivae and EOM are normal. No scleral icterus.  Neck: Normal range of motion. Neck supple. No JVD present. No tracheal deviation present. No  thyromegaly present.  Cardiovascular: Normal rate, regular rhythm and normal heart sounds.  Exam reveals no gallop and no friction rub.   No murmur heard. Respiratory: Effort normal. She has wheezes.  GI: Soft. Bowel sounds are normal. She exhibits no distension. There is no tenderness.  Genitourinary:  Genitourinary Comments: Deferred  Musculoskeletal: Normal range of motion. She exhibits no edema.  Lymphadenopathy:    She has no cervical adenopathy.  Neurological: She is alert and oriented to person, place, and time. No cranial nerve deficit. She exhibits normal muscle tone.  Skin: Skin is warm and dry. No rash noted. No erythema.  Psychiatric: She has a normal mood and affect. Her behavior is normal. Judgment and thought content normal.     Assessment/Plan This is a 82 year old female admitted for asthma exacerbation. 1. Asthma: Acute exacerbation; apparently triggered by dust and allergens. She still has diffuse wheezing bilaterally. I have ordered Solum  edrol 60 mg IV tomorrow after which we will transition to oral steroid taper. I have also started the patient on a maintenance inhaler due to recent exacerbations.duo nebs every 6 hours and albuterol every 4 hours when necessary for shortness of breath or wheezing. Continue antihistamine and Singulair for trigger control. 2. Acute respiratory failure: With hypoxia; now off of BiPAP. Continue to  monitor pulse oximetry. Wean oxygen as tolerated 3. Hypertension: new diagnosis; uncontrolled.I have started the patient on amlodipine. 4. Overweight: The patient's BMI is 29; encouraged healthy diet and exercise 5. DVT prophylaxis: Lovenox 6. GI prophylaxis: None The patient is a full code. Time spent on admission orders and critical patient care approximately45 minutes  Harrie Foreman, MD 04/25/2017, 8:10 AM

## 2017-04-26 LAB — CBC
HCT: 38.5 % (ref 35.0–47.0)
Hemoglobin: 12.7 g/dL (ref 12.0–16.0)
MCH: 30.6 pg (ref 26.0–34.0)
MCHC: 33.1 g/dL (ref 32.0–36.0)
MCV: 92.4 fL (ref 80.0–100.0)
Platelets: 169 10*3/uL (ref 150–440)
RBC: 4.17 MIL/uL (ref 3.80–5.20)
RDW: 14.4 % (ref 11.5–14.5)
WBC: 20.6 10*3/uL — ABNORMAL HIGH (ref 3.6–11.0)

## 2017-04-26 NOTE — Progress Notes (Signed)
Patient ambulated in hallway on room air, sats 89-92%.

## 2017-04-26 NOTE — Progress Notes (Signed)
[place] [facility] [place] NAME: Erika Finley    MR#:  [id number]  DATE OF BIRTH:  12-24-1935  SUBJECTIVE:  CHIEF COMPLAINT:   Chief Complaint  Patient presents with  . Shortness of Breath   - Remains on 2 L oxygen this morning. Still has cough and shortness of breath but improving since yesterday.  REVIEW OF SYSTEMS:  Review of Systems  Constitutional: Negative for chills, fever and malaise/fatigue.  HENT: Negative for congestion, ear discharge, hearing loss, nosebleeds and sinus pain.   Eyes: Negative for blurred vision and double vision.  Respiratory: Positive for cough, shortness of breath and wheezing.   Cardiovascular: Negative for chest pain, palpitations and leg swelling.  Gastrointestinal: Negative for abdominal pain, constipation, diarrhea, nausea and vomiting.  Genitourinary: Negative for dysuria and urgency.  Musculoskeletal: Negative for myalgias and neck pain.  Neurological: Negative for dizziness, sensory change, speech change, focal weakness, seizures and headaches.  Psychiatric/Behavioral: Negative for depression.    DRUG ALLERGIES:   Allergies  Allergen Reactions  . Sulfa Antibiotics Nausea Only  . Penicillins Rash    Has patient had a PCN reaction causing immediate rash, facial/tongue/throat swelling, SOB or lightheadedness with hypotension: Unknown Has patient had a PCN reaction causing severe rash involving mucus membranes or skin necrosis: Unknown Has patient had a PCN reaction that required hospitalization: Unknown Has patient had a PCN reaction occurring within the last 10 years: Unknown If all of the above answers are "NO", then may proceed with Cephalosporin use.     VITALS:  Blood pressure (!) 124/57, pulse 89, temperature 97.7 F (36.5 C), resp. rate (!) 118, height 5\' 5"  (1.651 m), weight 88.5 kg (195 lb 3.2 oz), SpO2 94 %.  PHYSICAL EXAMINATION:  Physical Exam  GENERAL:  81 y.o.-year-old patient  lying in the bed with no acute distress.  EYES: Pupils equal, round, reactive to light and accommodation. No scleral icterus. Extraocular muscles intact.  HEENT: Head atraumatic, normocephalic. Oropharynx and nasopharynx clear.  NECK:  Supple, no jugular venous distention. No thyroid enlargement, no tenderness.  LUNGS: Improving wheezing noted in the lung fields, worse on the left side today, moving air bilaterally. No rales,rhonchi or crepitation. No use of accessory muscles of respiration.  CARDIOVASCULAR: S1, S2 normal. No murmurs, rubs, or gallops.  ABDOMEN: Soft, nontender, nondistended. Bowel sounds present. No organomegaly or mass.  EXTREMITIES: No pedal edema, cyanosis, or clubbing.  NEUROLOGIC: Cranial nerves II through XII are intact. Muscle strength 5/5 in all extremities. Sensation intact. Gait not checked.  PSYCHIATRIC: The patient is alert and oriented x 3.  SKIN: No obvious rash, lesion, or ulcer.    LABORATORY PANEL:   CBC  Recent Labs Lab 04/26/17 0320  WBC 20.6*  HGB 12.7  HCT 38.5  PLT 169   ------------------------------------------------------------------------------------------------------------------  Chemistries   Recent Labs Lab 04/25/17 0035  NA 139  K 3.9  CL 104  CO2 25  GLUCOSE 147*  BUN 13  CREATININE 0.86  CALCIUM 9.2   ------------------------------------------------------------------------------------------------------------------  Cardiac Enzymes  Recent Labs Lab 04/25/17 0035  TROPONINI <0.03   ------------------------------------------------------------------------------------------------------------------  RADIOLOGY:  Dg Chest Port 1 View  Result Date: 04/25/2017 CLINICAL DATA:  81 y/o  F; audible wheeze. EXAM: PORTABLE CHEST 1 VIEW COMPARISON:  04/02/2017 CT and radiograph of the chest. FINDINGS: Stable heart size and mediastinal contours are within normal limits. Both lungs are clear. The visualized skeletal structures are  unremarkable. IMPRESSION: No acute pulmonary process identified.  Electronically Signed   By: Kristine Garbe M.D.   On: 04/25/2017 01:23    EKG:   Orders placed or performed during the hospital encounter of 04/25/17  . EKG 12-Lead  . EKG 12-Lead  . ED EKG  . ED EKG    ASSESSMENT AND PLAN:   81 year old female with no significant past medical history presents to hospital secondary to worsening shortness of breath and cough.  #1 acute reactive airway disease with bronchitis-had a similar episode last month for which she was treated acutely with inhalers and antibiotics. -she was treated with Advair and albuterol transiently a few years ago for reactive airway disease. -Dust exposure precipitated the last episode, none this episode. -Continue high-dose steroids and nebulizer treatments today. -Continue doxycycline and cough medications -chest x-ray is normal. Echocardiogram done 3 weeks ago shows normal EF. -Encourage incentive spirometry and ambulation today -Continue to wean off oxygen  #2 allergies- will need to follow up with immunologist as outpatient -Continue Singulair and Claritin  #3 Leukocytosis-acute stress reaction and also from steroids, follow-up.  #4 DVT prophylaxis-Lovenox     All the records are reviewed and case discussed with Care Management/Social Workerr. Management plans discussed with the patient, family and they are in agreement.  CODE STATUS: Full Code  TOTAL TIME TAKING CARE OF THIS PATIENT: 37 minutes.   POSSIBLE D/C IN 2 DAYS, DEPENDING ON CLINICAL CONDITION.   Nyx Keady M.D on 04/26/2017 at 7:57 AM  Between 7am to 6pm - Pager - [phone]  After 6pm go to [email or web address] - password [email or web address] [facility]  Office  [phone]  CC: Primary care physician; Rusty Aus, MD

## 2017-04-27 ENCOUNTER — Inpatient Hospital Stay: Payer: Medicare Other

## 2017-04-27 MED ORDER — METHYLPREDNISOLONE SODIUM SUCC 125 MG IJ SOLR
60.0000 mg | Freq: Two times a day (BID) | INTRAMUSCULAR | Status: DC
  Administered 2017-04-28: 60 mg via INTRAVENOUS
  Filled 2017-04-27: qty 2

## 2017-04-27 NOTE — Progress Notes (Signed)
E[facility] Finley at [place] NAME: Erika Finley    MR#:  [id number]  DATE OF BIRTH:  [birth year]  SUBJECTIVE:  CHIEF COMPLAINT:   Chief Complaint  Patient presents with  . Shortness of Breath   - Remains on 1-2 L oxygen this morning.  -Still has cough and shortness of breath but improving slowly, feels stronger though  REVIEW OF SYSTEMS:  Review of Systems  Constitutional: Negative for chills, fever and malaise/fatigue.  HENT: Negative for congestion, ear discharge, hearing loss, nosebleeds and sinus pain.   Eyes: Negative for blurred vision and double vision.  Respiratory: Positive for cough, shortness of breath and wheezing.   Cardiovascular: Negative for chest pain, palpitations and leg swelling.  Gastrointestinal: Negative for abdominal pain, constipation, diarrhea, nausea and vomiting.  Genitourinary: Negative for dysuria and urgency.  Musculoskeletal: Negative for myalgias and neck pain.  Neurological: Negative for dizziness, sensory change, speech change, focal weakness, seizures and headaches.  Psychiatric/Behavioral: Negative for depression.    DRUG ALLERGIES:   Allergies  Allergen Reactions  . Sulfa Antibiotics Nausea Only  . Penicillins Rash    Has patient had a PCN reaction causing immediate rash, facial/tongue/throat swelling, SOB or lightheadedness with hypotension: Unknown Has patient had a PCN reaction causing severe rash involving mucus membranes or skin necrosis: Unknown Has patient had a PCN reaction that required hospitalization: Unknown Has patient had a PCN reaction occurring within the last 10 years: Unknown If all of the above answers are "NO", then may proceed with Cephalosporin use.     VITALS:  Blood pressure 124/80, pulse 82, temperature 97.9 F (36.6 C), temperature source Oral, resp. rate 18, height 5\' 5"  (1.651 m), weight 88.5 kg (195 lb 2 oz), SpO2 92 %.  PHYSICAL EXAMINATION:  Physical  Exam  GENERAL:  81 y.o.-year-old patient lying in the bed with no acute distress.  EYES: Pupils equal, round, reactive to light and accommodation. No scleral icterus. Extraocular muscles intact.  HEENT: Head atraumatic, normocephalic. Oropharynx and nasopharynx clear.  NECK:  Supple, no jugular venous distention. No thyroid enlargement, no tenderness.  LUNGS: Improving wheezing noted in the lung fields, still tight on auscultation posteriorly, moving air bilaterally. No rales,rhonchi or crepitation. No use of accessory muscles of respiration.  CARDIOVASCULAR: S1, S2 normal. No murmurs, rubs, or gallops.  ABDOMEN: Soft, nontender, nondistended. Bowel sounds present. No organomegaly or mass.  EXTREMITIES: No pedal edema, cyanosis, or clubbing.  NEUROLOGIC: Cranial nerves II through XII are intact. Muscle strength 5/5 in all extremities. Sensation intact. Gait not checked.  PSYCHIATRIC: The patient is alert and oriented x 3.  SKIN: No obvious rash, lesion, or ulcer.    LABORATORY PANEL:   CBC  Recent Labs Lab 04/26/17 0320  WBC 20.6*  HGB 12.7  HCT 38.5  PLT 169   ------------------------------------------------------------------------------------------------------------------  Chemistries   Recent Labs Lab 04/25/17 0035  NA 139  K 3.9  CL 104  CO2 25  GLUCOSE 147*  BUN 13  CREATININE 0.86  CALCIUM 9.2   ------------------------------------------------------------------------------------------------------------------  Cardiac Enzymes  Recent Labs Lab 04/25/17 0035  TROPONINI <0.03   ------------------------------------------------------------------------------------------------------------------  RADIOLOGY:  Dg Chest 2 View  Result Date: 04/27/2017 CLINICAL DATA:  Wheezing, bronchitis. EXAM: CHEST  2 VIEW COMPARISON:  04/25/2017 and CT chest 04/02/2017. FINDINGS: Trachea is midline. Heart size normal. Linear subsegmental atelectasis in the left lower lobe. Lungs  are somewhat low in volume. Right hemidiaphragm is elevated. No airspace consolidation or pleural fluid.  IMPRESSION: No acute findings. Electronically Signed   By: Lorin Picket M.D.   On: 04/27/2017 08:09    EKG:   Orders placed or performed during the hospital encounter of 04/25/17  . EKG 12-Lead  . EKG 12-Lead  . ED EKG  . ED EKG    ASSESSMENT AND PLAN:   81 year old female with no significant past medical history presents to hospital secondary to worsening shortness of breath and cough.  #1 acute reactive airway disease with bronchitis-had a similar episode last month for which she was treated acutely with inhalers and antibiotics. -she was treated with Advair and albuterol transiently a few years ago for reactive airway disease. -Dust exposure precipitated the last episode, none this episode. -Continue high-dose steroids- decrease to bid dose  and nebulizer treatments. -Continue doxycycline and cough medications -chest x-ray is normal. Echocardiogram done 3 weeks ago shows normal EF. -Encourage incentive spirometry and ambulation today -Continue to wean off oxygen  #2 allergies- will need to follow up with immunologist as outpatient -Continue Singulair and Claritin  #3 Leukocytosis-acute stress reaction and also from steroids, follow-up.  #4 DVT prophylaxis-Lovenox     All the records are reviewed and case discussed with Care Management/Social Workerr. Management plans discussed with the patient, family and they are in agreement.  CODE STATUS: Full Code  TOTAL TIME TAKING CARE OF THIS PATIENT: 37 minutes.   POSSIBLE D/C IN 2 DAYS, DEPENDING ON CLINICAL CONDITION.   Gladstone Lighter M.D on 04/27/2017 at 1:34 PM  Between 7am to 6pm - Pager - [phone]  After 6pm go to [email or web address] - password [email or web address] [facility]  Office  [phone]  CC: Primary care physician; Rusty Aus, MD

## 2017-04-28 LAB — BASIC METABOLIC PANEL
ANION GAP: 6 (ref 5–15)
BUN: 31 mg/dL — ABNORMAL HIGH (ref 6–20)
CHLORIDE: 104 mmol/L (ref 101–111)
CO2: 28 mmol/L (ref 22–32)
Calcium: 8.8 mg/dL — ABNORMAL LOW (ref 8.9–10.3)
Creatinine, Ser: 0.8 mg/dL (ref 0.44–1.00)
GFR calc non Af Amer: >60 mL/min (ref >60)
GLUCOSE: 146 mg/dL — AB (ref 65–99)
POTASSIUM: 4.4 mmol/L (ref 3.5–5.1)
Sodium: 138 mmol/L (ref 135–145)

## 2017-04-28 LAB — CBC
HEMATOCRIT: 38.8 % (ref 35.0–47.0)
HEMOGLOBIN: 12.8 g/dL (ref 12.0–16.0)
MCH: 30.4 pg (ref 26.0–34.0)
MCHC: 33 g/dL (ref 32.0–36.0)
MCV: 92 fL (ref 80.0–100.0)
Platelets: 148 10*3/uL — ABNORMAL LOW (ref 150–440)
RBC: 4.22 MIL/uL (ref 3.80–5.20)
RDW: 14.4 % (ref 11.5–14.5)
WBC: 16 10*3/uL — ABNORMAL HIGH (ref 3.6–11.0)

## 2017-04-28 MED ORDER — IPRATROPIUM-ALBUTEROL 0.5-2.5 (3) MG/3ML IN SOLN: 3.0000 mL | Freq: Four times a day (QID) | RESPIRATORY_TRACT | 0 refills | Status: AC | PRN

## 2017-04-28 MED ORDER — ALBUTEROL SULFATE HFA 108 (90 BASE) MCG/ACT IN AERS: 2.0000 | INHALATION_SPRAY | Freq: Four times a day (QID) | RESPIRATORY_TRACT | 2 refills | Status: AC | PRN

## 2017-04-28 MED ORDER — PREDNISONE 10 MG PO TABS: 10.0000 mg | ORAL_TABLET | Freq: Every day | ORAL | 0 refills | Status: DC

## 2017-04-28 MED ORDER — HYDROCOD POLST-CPM POLST ER 10-8 MG/5ML PO SUER: 5.0000 mL | Freq: Two times a day (BID) | ORAL | 0 refills | Status: DC

## 2017-04-28 MED ORDER — MOMETASONE FURO-FORMOTEROL FUM 200-5 MCG/ACT IN AERO: 2.0000 | INHALATION_SPRAY | Freq: Two times a day (BID) | RESPIRATORY_TRACT | 2 refills | Status: DC

## 2017-04-28 MED ORDER — BENZONATATE 100 MG PO CAPS: 100.0000 mg | ORAL_CAPSULE | Freq: Three times a day (TID) | ORAL | 0 refills | Status: DC

## 2017-04-28 MED ORDER — DOXYCYCLINE HYCLATE 100 MG PO TABS: 100.0000 mg | ORAL_TABLET | Freq: Two times a day (BID) | ORAL | 0 refills | Status: DC

## 2017-04-28 NOTE — Discharge Summary (Signed)
[facility] [place] NAME: Kensy Blizard    MR#:  [id number]  DATE OF BIRTH:  February 20, 1936  DATE OF ADMISSION:  04/25/2017   ADMITTING PHYSICIAN: Harrie Foreman, MD  DATE OF DISCHARGE: 04/28/2017  PRIMARY CARE PHYSICIAN: Rusty Aus, MD   ADMISSION DIAGNOSIS:   SOB (shortness of breath) [R06.02]  DISCHARGE DIAGNOSIS:   Active Problems:   Asthma exacerbation   SECONDARY DIAGNOSIS:   Past Medical History:  Diagnosis Date  . Asthma   . Cancer ([place]) 1970   skin    HOSPITAL COURSE:   81 year old female with no significant past medical history presents to hospital secondary to worsening shortness of breath and cough.  #1 acute reactive airway disease with bronchitis-had a similar episode last month for which she was treated acutely with inhalers and antibiotics. -she was treated with Advair and albuterol transiently a few years ago for reactive airway disease. -Dust exposure precipitated the last episode, none this episode. -required high-dose steroids-, supplemental o2   and nebulizer treatments. -on doxycycline and cough medications- continue to finish the course -chest x-ray is normal. Echocardiogram done 3 weeks ago shows normal EF. -Encourage incentive spirometry and ambulation- weaned off o2- sats around 92% -discharge on dulera inhaler and prn albuterol. Outpatient f/u with PCP about continuing maintenance inhaler  #2 allergies- will need to follow up with immunologist as outpatient -Continue Singulair and allegra as outpatient  #3 Leukocytosis-acute stress reaction and also from steroids, Improving  Feels better and wants to go home today  DISCHARGE CONDITIONS:   Guarded  CONSULTS OBTAINED:   None  DRUG ALLERGIES:   Allergies  Allergen Reactions  . Sulfa Antibiotics Nausea Only  . Penicillins Rash    Has patient had a PCN reaction causing immediate rash, facial/tongue/throat swelling, SOB or  lightheadedness with hypotension: Unknown Has patient had a PCN reaction causing severe rash involving mucus membranes or skin necrosis: Unknown Has patient had a PCN reaction that required hospitalization: Unknown Has patient had a PCN reaction occurring within the last 10 years: Unknown If all of the above answers are "NO", then may proceed with Cephalosporin use.    DISCHARGE MEDICATIONS:   Allergies as of 04/28/2017      Reactions   Sulfa Antibiotics Nausea Only   Penicillins Rash   Has patient had a PCN reaction causing immediate rash, facial/tongue/throat swelling, SOB or lightheadedness with hypotension: Unknown Has patient had a PCN reaction causing severe rash involving mucus membranes or skin necrosis: Unknown Has patient had a PCN reaction that required hospitalization: Unknown Has patient had a PCN reaction occurring within the last 10 years: Unknown If all of the above answers are "NO", then may proceed with Cephalosporin use.      Medication List    TAKE these medications   albuterol 108 (90 Base) MCG/ACT inhaler Commonly known as:  PROVENTIL HFA;VENTOLIN HFA Inhale 2 puffs into the lungs every 6 (six) hours as needed for wheezing or shortness of breath.   ALPRAZolam 0.25 MG tablet Commonly known as:  XANAX Take 0.25 mg by mouth daily as needed for anxiety.   aspirin EC 81 MG tablet Take 81 mg by mouth 3 (three) times a week.   benzonatate 100 MG capsule Commonly known as:  TESSALON Take 1 capsule (100 mg total) by mouth 3 (three) times daily.   Calcium-Vitamin D 600-200 MG-UNIT tablet Take 1 tablet by mouth daily.   chlorpheniramine-HYDROcodone 10-8 MG/5ML Suer Commonly  known as:  TUSSIONEX Take 5 mLs by mouth every 12 (twelve) hours.   doxycycline 100 MG tablet Commonly known as:  VIBRA-TABS Take 1 tablet (100 mg total) by mouth every 12 (twelve) hours. X 7 more days   fexofenadine 180 MG tablet Commonly known as:  ALLEGRA Take 180 mg by mouth  daily.   ipratropium-albuterol 0.5-2.5 (3) MG/3ML Soln Commonly known as:  DUONEB Take 3 mLs by nebulization every 6 (six) hours as needed.   mometasone-formoterol 200-5 MCG/ACT Aero Commonly known as:  DULERA Inhale 2 puffs into the lungs 2 (two) times daily.   montelukast 10 MG tablet Commonly known as:  SINGULAIR Take 10 mg by mouth at bedtime.   MULTI-VITAMINS Tabs Take 1 tablet by mouth daily.   predniSONE 10 MG tablet Commonly known as:  DELTASONE Take 1 tablet (10 mg total) by mouth daily. 6 tabs PO x 2 days 5 tabs PO x 2 days 4 tabs PO x 2 days 3 tabs PO x 2 days 2 tabs PO x 2 days 1 tab PO x 2 days and stop            Durable Medical Equipment        Start     Ordered   04/28/17 0917  For home use only DME Nebulizer machine  Once    Question:  Patient needs a nebulizer to treat with the following condition  Answer:  Bronchitis   04/28/17 0918       DISCHARGE INSTRUCTIONS:   1. PCP follow-up in 1-2 weeks  DIET:   Cardiac diet  ACTIVITY:   Activity as tolerated  OXYGEN:   Home Oxygen: No.  Oxygen Delivery: room air  DISCHARGE LOCATION:   home   If you experience worsening of your admission symptoms, develop shortness of breath, life threatening emergency, suicidal or homicidal thoughts you must seek medical attention immediately by calling 911 or calling your MD immediately  if symptoms less severe.  You Must read complete instructions/literature along with all the possible adverse reactions/side effects for all the Medicines you take and that have been prescribed to you. Take any new Medicines after you have completely understood and accpet all the possible adverse reactions/side effects.   Please note  You were cared for by a hospitalist during your hospital stay. If you have any questions about your discharge medications or the care you received while you were in the hospital after you are discharged, you can call the unit and asked to speak  with the hospitalist on call if the hospitalist that took care of you is not available. Once you are discharged, your primary care physician will handle any further medical issues. Please note that NO REFILLS for any discharge medications will be authorized once you are discharged, as it is imperative that you return to your primary care physician (or establish a relationship with a primary care physician if you do not have one) for your aftercare needs so that they can reassess your need for medications and monitor your lab values.    On the day of Discharge:  VITAL SIGNS:   Blood pressure (!) 146/75, pulse 90, temperature 98.3 F (36.8 C), temperature source Oral, resp. rate 18, height 5\' 5"  (1.651 m), weight 88.7 kg (195 lb 8 oz), SpO2 90 %.  PHYSICAL EXAMINATION:    GENERAL:  81 y.o.-year-old patient lying in the bed with no acute distress.  EYES: Pupils equal, round, reactive to light and accommodation. No scleral  icterus. Extraocular muscles intact.  HEENT: Head atraumatic, normocephalic. Oropharynx and nasopharynx clear.  NECK:  Supple, no jugular venous distention. No thyroid enlargement, no tenderness.  LUNGS: Only occasional wheezing noted in the lung fields, moving air bilaterally. No rales,rhonchi or crepitation. No use of accessory muscles of respiration.  CARDIOVASCULAR: S1, S2 normal. No murmurs, rubs, or gallops.  ABDOMEN: Soft, nontender, nondistended. Bowel sounds present. No organomegaly or mass.  EXTREMITIES: No pedal edema, cyanosis, or clubbing.  NEUROLOGIC: Cranial nerves II through XII are intact. Muscle strength 5/5 in all extremities. Sensation intact. Gait not checked.  PSYCHIATRIC: The patient is alert and oriented x 3.  SKIN: No obvious rash, lesion, or ulcer.    DATA REVIEW:   CBC  Recent Labs Lab 04/28/17 0424  WBC 16.0*  HGB 12.8  HCT 38.8  PLT 148*    Chemistries   Recent Labs Lab 04/28/17 0424  NA 138  K 4.4  CL 104  CO2 28  GLUCOSE  146*  BUN 31*  CREATININE 0.80  CALCIUM 8.8*     Microbiology Results  No results found for this or any previous visit.  RADIOLOGY:  No results found.   Management plans discussed with the patient, family and they are in agreement.  CODE STATUS:     Code Status Orders        Start     Ordered   04/25/17 0522  Full code  Continuous     04/25/17 0522    Code Status History    Date Active Date Inactive Code Status Order ID Comments User Context   04/02/2017 10:19 AM 04/03/2017  4:56 PM Full Code [id number]  Vaughan Basta, MD ED    Advance Directive Documentation     Most Recent Value  Type of Advance Directive  Healthcare Power of [place], Living will  Pre-existing out of facility DNR order (yellow form or pink MOST form)  -  "MOST" Form in Place?  -      TOTAL TIME TAKING CARE OF THIS PATIENT: 38 minutes.    Gladstone Lighter M.D on 04/28/2017 at 9:18 AM  Between 7am to 6pm - Pager - [phone]  After 6pm go to [email or web address] - Proofreader  [facility]  Office  [phone]  CC: Primary care physician; Rusty Aus, MD   Note: This dictation was prepared with Dragon dictation along with smaller phrase technology. Any transcriptional errors that result from this process are unintentional.

## 2017-04-28 NOTE — Progress Notes (Signed)
Patient is alert and oriented and able to verbalize needs. No complaints of pain at this time. VSS. PIV removed. Printed AVS and rx given to patient. Son will provide transportation home. NT transported pt to car via wc.  Bethann Punches, RN

## 2017-04-28 NOTE — Care Management Note (Addendum)
Case Management Note  Patient Details  Name: DESTYNI HOPPEL MRN: [id number] Date of Birth: 12/24/1935  Subjective/Objective:  Met with patient at bedside to discuss discharge planning. Patient lives at home with her son. She is independent, active and drives. No DME.  Not home bound. Not appropriate for home health nursing at this time. MD made ware. Ordered nebulizer from [facility]. No further needs identified. Case closed                  Action/Plan:   Expected Discharge Date:  04/28/17               Expected Discharge Plan:  Home/Self Care  In-House Referral:     Discharge planning Services  CM Consult, Homebound not met per provider  Post Acute Care Choice:    Choice offered to:  Patient  DME Arranged:   Nebulizer DME Agency:   [place] Arranged:  Patient Refused [facility] Agency:     Status of Service:  Completed, signed off  If discussed at [place] of Stay Meetings, dates discussed:    Additional Comments:  Jolly Mango, RN 04/28/2017, 9:48 AM

## 2017-06-11 ENCOUNTER — Other Ambulatory Visit: Payer: Self-pay

## 2017-06-11 ENCOUNTER — Inpatient Hospital Stay
Admission: EM | Admit: 2017-06-11 | Discharge: 2017-06-13 | DRG: 202 | Disposition: A | Discharge status: Home / Self Care | Payer: Medicare Other | Attending: Internal Medicine | Admitting: Internal Medicine

## 2017-06-11 ENCOUNTER — Encounter: Payer: Self-pay | Admitting: Internal Medicine

## 2017-06-11 ENCOUNTER — Emergency Department: Payer: Medicare Other

## 2017-06-11 DIAGNOSIS — J4541 Moderate persistent asthma with (acute) exacerbation: Principal | ICD-10-CM | POA: Diagnosis present

## 2017-06-11 DIAGNOSIS — Z8249 Family history of ischemic heart disease and other diseases of the circulatory system: Secondary | ICD-10-CM | POA: Diagnosis not present

## 2017-06-11 DIAGNOSIS — J9601 Acute respiratory failure with hypoxia: Secondary | ICD-10-CM | POA: Diagnosis present

## 2017-06-11 DIAGNOSIS — Z79899 Other long term (current) drug therapy: Secondary | ICD-10-CM

## 2017-06-11 DIAGNOSIS — Z7951 Long term (current) use of inhaled steroids: Secondary | ICD-10-CM

## 2017-06-11 DIAGNOSIS — Z7982 Long term (current) use of aspirin: Secondary | ICD-10-CM | POA: Diagnosis not present

## 2017-06-11 DIAGNOSIS — T486X5A Adverse effect of antiasthmatics, initial encounter: Secondary | ICD-10-CM | POA: Diagnosis present

## 2017-06-11 DIAGNOSIS — J209 Acute bronchitis, unspecified: Secondary | ICD-10-CM | POA: Diagnosis present

## 2017-06-11 HISTORY — DX: Unspecified asthma, uncomplicated: J45.909

## 2017-06-11 LAB — CBC
HEMATOCRIT: 45.2 % (ref 35.0–47.0)
HEMOGLOBIN: 14.8 g/dL (ref 12.0–16.0)
MCH: 29.7 pg (ref 26.0–34.0)
MCHC: 32.8 g/dL (ref 32.0–36.0)
MCV: 90.7 fL (ref 80.0–100.0)
Platelets: 288 10*3/uL (ref 150–440)
RBC: 4.99 MIL/uL (ref 3.80–5.20)
RDW: 14.8 % — AB (ref 11.5–14.5)
WBC: 13.2 10*3/uL — ABNORMAL HIGH (ref 3.6–11.0)

## 2017-06-11 LAB — COMPREHENSIVE METABOLIC PANEL
ALK PHOS: 93 U/L (ref 38–126)
ALT: 16 U/L (ref 14–54)
ANION GAP: 11 (ref 5–15)
AST: 30 U/L (ref 15–41)
Albumin: 4 g/dL (ref 3.5–5.0)
BILIRUBIN TOTAL: 0.6 mg/dL (ref 0.3–1.2)
BUN: 16 mg/dL (ref 6–20)
CALCIUM: 9.6 mg/dL (ref 8.9–10.3)
CO2: 27 mmol/L (ref 22–32)
Chloride: 102 mmol/L (ref 101–111)
Creatinine, Ser: 0.68 mg/dL (ref 0.44–1.00)
GFR calc non Af Amer: >60 mL/min (ref >60)
Glucose, Bld: 126 mg/dL — ABNORMAL HIGH (ref 65–99)
POTASSIUM: 3.9 mmol/L (ref 3.5–5.1)
SODIUM: 140 mmol/L (ref 135–145)
TOTAL PROTEIN: 8.6 g/dL — AB (ref 6.5–8.1)

## 2017-06-11 LAB — TROPONIN I: Troponin I: <0.03 ng/mL (ref <0.03)

## 2017-06-11 LAB — BRAIN NATRIURETIC PEPTIDE: B Natriuretic Peptide: 334 pg/mL — ABNORMAL HIGH (ref 0.0–100.0)

## 2017-06-11 MED ORDER — IPRATROPIUM-ALBUTEROL 0.5-2.5 (3) MG/3ML IN SOLN
RESPIRATORY_TRACT | Status: AC
  Administered 2017-06-11: 3 mL via RESPIRATORY_TRACT
  Filled 2017-06-11: qty 9

## 2017-06-11 MED ORDER — ONDANSETRON HCL 4 MG/2ML IJ SOLN: 4.0000 mg | Freq: Four times a day (QID) | INTRAMUSCULAR | Status: DC | PRN

## 2017-06-11 MED ORDER — ONDANSETRON HCL 4 MG/2ML IJ SOLN
4.0000 mg | Freq: Once | INTRAMUSCULAR | Status: AC
  Administered 2017-06-11: 4 mg via INTRAVENOUS

## 2017-06-11 MED ORDER — PANTOPRAZOLE SODIUM 40 MG PO TBEC
40.0000 mg | DELAYED_RELEASE_TABLET | Freq: Every day | ORAL | Status: DC
  Administered 2017-06-11 – 2017-06-13 (×3): 40 mg via ORAL
  Filled 2017-06-11 (×3): qty 1

## 2017-06-11 MED ORDER — BENZONATATE 100 MG PO CAPS
100.0000 mg | ORAL_CAPSULE | Freq: Three times a day (TID) | ORAL | Status: DC
  Administered 2017-06-11 – 2017-06-13 (×6): 100 mg via ORAL
  Filled 2017-06-11 (×6): qty 1

## 2017-06-11 MED ORDER — ACETAMINOPHEN 325 MG PO TABS: 650.0000 mg | ORAL_TABLET | Freq: Four times a day (QID) | ORAL | Status: DC | PRN

## 2017-06-11 MED ORDER — ACETAMINOPHEN 650 MG RE SUPP: 650.0000 mg | Freq: Four times a day (QID) | RECTAL | Status: DC | PRN

## 2017-06-11 MED ORDER — IPRATROPIUM-ALBUTEROL 0.5-2.5 (3) MG/3ML IN SOLN
3.0000 mL | Freq: Once | RESPIRATORY_TRACT | Status: AC
  Administered 2017-06-11: 3 mL via RESPIRATORY_TRACT

## 2017-06-11 MED ORDER — IPRATROPIUM-ALBUTEROL 0.5-2.5 (3) MG/3ML IN SOLN
3.0000 mL | RESPIRATORY_TRACT | Status: DC
  Administered 2017-06-11 – 2017-06-13 (×12): 3 mL via RESPIRATORY_TRACT
  Filled 2017-06-11 (×12): qty 3

## 2017-06-11 MED ORDER — POLYETHYLENE GLYCOL 3350 17 G PO PACK: 17.0000 g | PACK | Freq: Every day | ORAL | Status: DC | PRN

## 2017-06-11 MED ORDER — MONTELUKAST SODIUM 10 MG PO TABS
10.0000 mg | ORAL_TABLET | Freq: Every day | ORAL | Status: DC
  Administered 2017-06-11 – 2017-06-12 (×2): 10 mg via ORAL
  Filled 2017-06-11 (×2): qty 1

## 2017-06-11 MED ORDER — ONDANSETRON HCL 4 MG PO TABS
4.0000 mg | ORAL_TABLET | Freq: Four times a day (QID) | ORAL | Status: DC | PRN
Start: 2017-06-11 — End: 2017-06-13

## 2017-06-11 MED ORDER — ONDANSETRON HCL 4 MG/2ML IJ SOLN
INTRAMUSCULAR | Status: AC
  Administered 2017-06-11: 4 mg via INTRAVENOUS
  Filled 2017-06-11: qty 2

## 2017-06-11 MED ORDER — METHYLPREDNISOLONE SODIUM SUCC 125 MG IJ SOLR
125.0000 mg | Freq: Once | INTRAMUSCULAR | Status: AC
  Administered 2017-06-11: 125 mg via INTRAVENOUS

## 2017-06-11 MED ORDER — METHYLPREDNISOLONE SODIUM SUCC 125 MG IJ SOLR
INTRAMUSCULAR | Status: AC
  Administered 2017-06-11: 125 mg via INTRAVENOUS
  Filled 2017-06-11: qty 2

## 2017-06-11 MED ORDER — SODIUM CHLORIDE 0.9% FLUSH
3.0000 mL | Freq: Two times a day (BID) | INTRAVENOUS | Status: DC
  Administered 2017-06-11 – 2017-06-13 (×6): 3 mL via INTRAVENOUS

## 2017-06-11 MED ORDER — METHYLPREDNISOLONE SODIUM SUCC 125 MG IJ SOLR
60.0000 mg | Freq: Two times a day (BID) | INTRAMUSCULAR | Status: DC
  Administered 2017-06-11 – 2017-06-13 (×4): 60 mg via INTRAVENOUS
  Filled 2017-06-11 (×4): qty 2

## 2017-06-11 MED ORDER — ASPIRIN EC 81 MG PO TBEC
81.0000 mg | DELAYED_RELEASE_TABLET | ORAL | Status: DC
  Administered 2017-06-12: 81 mg via ORAL
  Filled 2017-06-11: qty 1

## 2017-06-11 MED ORDER — HYDROCOD POLST-CPM POLST ER 10-8 MG/5ML PO SUER
5.0000 mL | Freq: Two times a day (BID) | ORAL | Status: DC
  Administered 2017-06-11 – 2017-06-13 (×4): 5 mL via ORAL
  Filled 2017-06-11 (×4): qty 5

## 2017-06-11 MED ORDER — MAGNESIUM SULFATE 2 GM/50ML IV SOLN
2.0000 g | Freq: Once | INTRAVENOUS | Status: AC
  Administered 2017-06-11: 2 g via INTRAVENOUS
  Filled 2017-06-11: qty 50

## 2017-06-11 MED ORDER — ENOXAPARIN SODIUM 40 MG/0.4ML ~~LOC~~ SOLN
40.0000 mg | SUBCUTANEOUS | Status: DC
  Administered 2017-06-11 – 2017-06-12 (×2): 40 mg via SUBCUTANEOUS
  Filled 2017-06-11 (×3): qty 0.4

## 2017-06-11 MED ORDER — MOMETASONE FURO-FORMOTEROL FUM 200-5 MCG/ACT IN AERO
2.0000 | INHALATION_SPRAY | Freq: Two times a day (BID) | RESPIRATORY_TRACT | Status: DC
  Administered 2017-06-11 – 2017-06-13 (×4): 2 via RESPIRATORY_TRACT
  Filled 2017-06-11: qty 8.8

## 2017-06-11 MED ORDER — ALBUTEROL SULFATE (2.5 MG/3ML) 0.083% IN NEBU
2.5000 mg | INHALATION_SOLUTION | RESPIRATORY_TRACT | Status: DC | PRN
  Filled 2017-06-11: qty 3

## 2017-06-11 NOTE — Care Management (Signed)
Third admission third presentation since September 2018 for same symptoms.  She has a home nebulizer and thinks "this is all I need."  Discussed that prior to discharge would be assessed for need of home oxygen.  She is independent in her adls, no issues accessing medical care, and no issues with transportation. She says that she does not want to even consider home oxygen.  Her son lives in the same residence.

## 2017-06-11 NOTE — H&P (Signed)
[facility] [place] NAME: Erika Finley    MR#:  [id number]  DATE OF BIRTH:  1936-05-23  DATE OF ADMISSION:  06/11/2017  PRIMARY CARE PHYSICIAN: Rusty Aus, MD   REQUESTING/REFERRING PHYSICIAN: Dr. Kerman Passey  CHIEF COMPLAINT:   Chief Complaint  Patient presents with  . Respiratory Distress    HISTORY OF PRESENT ILLNESS:  Erika Finley  is a 81 y.o. female with a known history of reactive airway disease, arthritis who is been dealing with recurrent bronchitis, shortness of breath since October presents to the emergency room with worsening shortness of breath, cough and wheezing for 2 days.  Patient was initially admitted in October for similar complaints.  Had to be on a BiPAP and then transition to nasal cannula and discharged home on room air. Today patient saturations were 84% on room air.  Was given multiple nebulizers and continues to have acute hypoxic respiratory failure with the wheezing and shortness of breath.  No infiltrates on chest x-ray.  Recent echocardiogram was normal.  She is on Dulera and DuoNeb nebulizer at home.  PAST MEDICAL HISTORY:   Past Medical History:  Diagnosis Date  . Asthma   . Cancer ([place]) 1970   skin    PAST SURGICAL HISTORY:   Past Surgical History:  Procedure Laterality Date  . APPENDECTOMY    . TONSILLECTOMY AND ADENOIDECTOMY      SOCIAL HISTORY:   Social History   Tobacco Use  . Smoking status: Never Smoker  . Smokeless tobacco: Never Used  Substance Use Topics  . Alcohol use: Yes    Alcohol/week: 1.2 oz    Types: 2 Glasses of wine per week    Comment: 1/4 glass before dinner    FAMILY HISTORY:   Family History  Problem Relation Age of Onset  . Valvular heart disease Brother   . Breast cancer Neg Hx     DRUG ALLERGIES:   Allergies  Allergen Reactions  . Sulfa Antibiotics Nausea Only  . Penicillins Rash    Has patient had a PCN reaction causing immediate rash,  facial/tongue/throat swelling, SOB or lightheadedness with hypotension: Unknown Has patient had a PCN reaction causing severe rash involving mucus membranes or skin necrosis: Unknown Has patient had a PCN reaction that required hospitalization: Unknown Has patient had a PCN reaction occurring within the last 10 years: Unknown If all of the above answers are "NO", then may proceed with Cephalosporin use.     REVIEW OF SYSTEMS:   Review of Systems  Constitutional: Positive for malaise/fatigue. Negative for chills, fever and weight loss.  HENT: Negative for hearing loss and nosebleeds.   Eyes: Negative for blurred vision, double vision and pain.  Respiratory: Positive for cough, sputum production, shortness of breath and wheezing. Negative for hemoptysis.   Cardiovascular: Negative for chest pain, palpitations, orthopnea and leg swelling.  Gastrointestinal: Negative for abdominal pain, constipation, diarrhea, nausea and vomiting.  Genitourinary: Negative for dysuria and hematuria.  Musculoskeletal: Negative for back pain, falls and myalgias.  Skin: Negative for rash.  Neurological: Positive for weakness. Negative for dizziness, tremors, sensory change, speech change, focal weakness, seizures and headaches.  Endo/Heme/Allergies: Does not bruise/bleed easily.  Psychiatric/Behavioral: Negative for depression and memory loss. The patient is not nervous/anxious.     MEDICATIONS AT HOME:   Prior to Admission medications   Medication Sig Start Date End Date Taking? Authorizing Provider  albuterol (PROVENTIL HFA;VENTOLIN HFA) 108 (90 Base) MCG/ACT inhaler Inhale 2  puffs into the lungs every 6 (six) hours as needed for wheezing or shortness of breath. 04/28/17  Yes Gladstone Lighter, MD  aspirin EC 81 MG tablet Take 81 mg by mouth 3 (three) times a week.   Yes [provider]  Calcium-Vitamin D 600-200 MG-UNIT tablet Take 1 tablet by mouth daily.   Yes [provider]   fexofenadine (ALLEGRA) 180 MG tablet Take 180 mg by mouth daily. 10/17/16  Yes [provider]  ipratropium-albuterol (DUONEB) 0.5-2.5 (3) MG/3ML SOLN Take 3 mLs by nebulization every 6 (six) hours as needed. 04/28/17  Yes Gladstone Lighter, MD  montelukast (SINGULAIR) 10 MG tablet Take 10 mg by mouth at bedtime. 02/27/17  Yes [provider]  Multiple Vitamin (MULTI-VITAMINS) TABS Take 1 tablet by mouth daily.   Yes [provider]  pantoprazole (PROTONIX) 40 MG tablet Take 1 tablet by mouth daily. 06/10/17  Yes [provider]  SYMBICORT 160-4.5 MCG/ACT inhaler Inhale 2 puffs into the lungs 2 (two) times daily.  06/10/17  Yes [provider]  benzonatate (TESSALON) 100 MG capsule Take 1 capsule (100 mg total) by mouth 3 (three) times daily. Patient not taking: Reported on 06/11/2017 04/28/17   Gladstone Lighter, MD  chlorpheniramine-HYDROcodone (TUSSIONEX) 10-8 MG/5ML SUER Take 5 mLs by mouth every 12 (twelve) hours. Patient not taking: Reported on 06/11/2017 04/28/17   Gladstone Lighter, MD  doxycycline (VIBRA-TABS) 100 MG tablet Take 1 tablet (100 mg total) by mouth every 12 (twelve) hours. X 7 more days Patient not taking: Reported on 06/11/2017 04/28/17   Gladstone Lighter, MD  mometasone-formoterol [facility]) 200-5 MCG/ACT AERO Inhale 2 puffs into the lungs 2 (two) times daily. 04/28/17   Gladstone Lighter, MD  predniSONE (DELTASONE) 10 MG tablet Take 1 tablet (10 mg total) by mouth daily. 6 tabs PO x 2 days 5 tabs PO x 2 days 4 tabs PO x 2 days 3 tabs PO x 2 days 2 tabs PO x 2 days 1 tab PO x 2 days and stop Patient not taking: Reported on 06/11/2017 04/28/17   Gladstone Lighter, MD     VITAL SIGNS:  Blood pressure (!) 158/64, pulse (!) 128, temperature 98.5 F (36.9 C), temperature source Oral, resp. rate (!) 26, SpO2 100 %.  PHYSICAL EXAMINATION:  Physical Exam  GENERAL:  81 y.o.-year-old patient lying in the bed with conversational  dyspnea EYES: Pupils equal, round, reactive to light and accommodation. No scleral icterus. Extraocular muscles intact.  HEENT: Head atraumatic, normocephalic. Oropharynx and nasopharynx clear. No oropharyngeal erythema, moist oral mucosa  NECK:  Supple, no jugular venous distention. No thyroid enlargement, no tenderness.  LUNGS: Increased work of breathing with bilateral wheezing and decreased air entry CARDIOVASCULAR: S1, S2 normal. No murmurs, rubs, or gallops.  ABDOMEN: Soft, nontender, nondistended. Bowel sounds present. No organomegaly or mass.  EXTREMITIES: No pedal edema, cyanosis, or clubbing. + 2 pedal & radial pulses b/l.   NEUROLOGIC: Cranial nerves II through XII are intact. No focal Motor or sensory deficits appreciated b/l PSYCHIATRIC: The patient is alert and oriented x 3. Good affect.  SKIN: No obvious rash, lesion, or ulcer.   LABORATORY PANEL:   CBC Recent Labs  Lab 06/11/17 1129  WBC 13.2*  HGB 14.8  HCT 45.2  PLT 288   ------------------------------------------------------------------------------------------------------------------  Chemistries  Recent Labs  Lab 06/11/17 1129  NA 140  K 3.9  CL 102  CO2 27  GLUCOSE 126*  BUN 16  CREATININE 0.68  CALCIUM 9.6  AST  30  ALT 16  ALKPHOS 93  BILITOT 0.6   ------------------------------------------------------------------------------------------------------------------  Cardiac Enzymes Recent Labs  Lab 06/11/17 1129  TROPONINI <0.03   ------------------------------------------------------------------------------------------------------------------  RADIOLOGY:  Dg Chest Portable 1 View  Result Date: 06/11/2017 CLINICAL DATA:  Shortness of breath and wheezing EXAM: PORTABLE CHEST 1 VIEW COMPARISON:  April 27, 2017 FINDINGS: There is no edema or consolidation. Heart size and pulmonary vascularity are normal. No adenopathy. No evident bone lesions. IMPRESSION: No edema or consolidation.  Electronically Signed   By: Lowella Grip III M.D.   On: 06/11/2017 12:00     IMPRESSION AND PLAN:   *Acute bronchitis with acute hypoxic respiratory failure. Wean oxygen as tolerated.  IV steroids, scheduled nebulizers.  No antibiotics needed.  Will consult pulmonary due to recurrent admissions. Will need BiPAP if any worsening.  *Sinus tachycardia due to respiratory distress and also albuterol.  Monitor on telemetry overnight.  *DVT prophylaxis with Lovenox  All the records are reviewed and case discussed with ED provider. Management plans discussed with the patient, family and they are in agreement.  CODE STATUS: Full code  TOTAL TIME TAKING CARE OF THIS PATIENT: 35 minutes.   Leia Alf John Williamsen M.D on 06/11/2017 at 2:17 PM  Between 7am to 6pm - Pager - [phone]  After 6pm go to [email or web address] - password [email or web address] [facility]  Office  [phone]  CC: Primary care physician; Rusty Aus, MD  Note: This dictation was prepared with Dragon dictation along with smaller phrase technology. Any transcriptional errors that result from this process are unintentional.

## 2017-06-11 NOTE — ED Triage Notes (Signed)
Pt c/o shortness of breath since 9pm last night, pt on arrival exhibiting shallow breathing with accessory muscle use, audible wheezing. Unable to complete short sentences without running out of air.   Pt using nebs and inhalers at home.

## 2017-06-11 NOTE — ED Provider Notes (Signed)
[facility] Emergency Department Provider Note  Time seen: 11:36 AM  I have reviewed the triage vital signs and the nursing notes.   HISTORY  Chief Complaint Respiratory Distress    HPI Erika STROME is a 81 y.o. female with a past medical history of asthma who presents to the ER today for difficulty breathing.  Patient states since last night she has been having difficulty breathing.  She has been using her inhaler as well as her nebulizer at home without relief.  Upon arrival patient is in moderate respiratory distress with tachypnea and a room air pulse ox of 87%.  No home O2 requirement at baseline.  Patient states she has never smoked cigarettes.  No history of CHF or COPD.  Patient states she has been coughing today but denies any cough yesterday, denies any fever.  Past Medical History:  Diagnosis Date  . Asthma   . Cancer [facility]) 1970   skin    Patient Active Problem List   Diagnosis Date Noted  . Asthma exacerbation 04/25/2017  . Chest pain 04/02/2017    Past Surgical History:  Procedure Laterality Date  . APPENDECTOMY    . TONSILLECTOMY AND ADENOIDECTOMY      Prior to Admission medications   Medication Sig Start Date End Date Taking? Authorizing Provider  albuterol (PROVENTIL HFA;VENTOLIN HFA) 108 (90 Base) MCG/ACT inhaler Inhale 2 puffs into the lungs every 6 (six) hours as needed for wheezing or shortness of breath. 04/28/17   Gladstone Lighter, MD  ALPRAZolam Duanne [facility]) 0.25 MG tablet Take 0.25 mg by mouth daily as needed for anxiety.  03/30/17   [provider]  aspirin EC 81 MG tablet Take 81 mg by mouth 3 (three) times a week.    [provider]  benzonatate (TESSALON) 100 MG capsule Take 1 capsule (100 mg total) by mouth 3 (three) times daily. 04/28/17   Gladstone Lighter, MD  Calcium-Vitamin D 600-200 MG-UNIT tablet Take 1 tablet by mouth daily.    [provider]  chlorpheniramine-HYDROcodone (TUSSIONEX)  10-8 MG/5ML SUER Take 5 mLs by mouth every 12 (twelve) hours. 04/28/17   Gladstone Lighter, MD  doxycycline (VIBRA-TABS) 100 MG tablet Take 1 tablet (100 mg total) by mouth every 12 (twelve) hours. X 7 more days 04/28/17   Gladstone Lighter, MD  fexofenadine (ALLEGRA) 180 MG tablet Take 180 mg by mouth daily. 10/17/16   [provider]  ipratropium-albuterol (DUONEB) 0.5-2.5 (3) MG/3ML SOLN Take 3 mLs by nebulization every 6 (six) hours as needed. 04/28/17   Gladstone Lighter, MD  mometasone-formoterol (DULERA) 200-5 MCG/ACT AERO Inhale 2 puffs into the lungs 2 (two) times daily. 04/28/17   Gladstone Lighter, MD  montelukast (SINGULAIR) 10 MG tablet Take 10 mg by mouth at bedtime. 02/27/17   [provider]  Multiple Vitamin (MULTI-VITAMINS) TABS Take 1 tablet by mouth daily.    [provider]  predniSONE (DELTASONE) 10 MG tablet Take 1 tablet (10 mg total) by mouth daily. 6 tabs PO x 2 days 5 tabs PO x 2 days 4 tabs PO x 2 days 3 tabs PO x 2 days 2 tabs PO x 2 days 1 tab PO x 2 days and stop 04/28/17   Gladstone Lighter, MD    Allergies  Allergen Reactions  . Sulfa Antibiotics Nausea Only  . Penicillins Rash    Has patient had a PCN reaction causing immediate rash, facial/tongue/throat swelling, SOB or lightheadedness with hypotension: Unknown Has patient had a PCN reaction  causing severe rash involving mucus membranes or skin necrosis: Unknown Has patient had a PCN reaction that required hospitalization: Unknown Has patient had a PCN reaction occurring within the last 10 years: Unknown If all of the above answers are "NO", then may proceed with Cephalosporin use.     Family History  Problem Relation Age of Onset  . Valvular heart disease Brother   . Breast cancer Neg Hx     Social History Social History   Tobacco Use  . Smoking status: Never Smoker  . Smokeless tobacco: Never Used  Substance Use Topics  . Alcohol use: Yes    Alcohol/week: 1.2  oz    Types: 2 Glasses of wine per week    Comment: 1/4 glass before dinner  . Drug use: No    Review of Systems Constitutional: Negative for fever. ENT: Negative for congestion Cardiovascular: Negative for chest pain. Respiratory: Positive for shortness of breath.  Cough today. Gastrointestinal: Negative for abdominal pain, vomiting Neurological: Negative for headache All other ROS negative  ____________________________________________   PHYSICAL EXAM:  VITAL SIGNS: ED Triage Vitals  Enc Vitals Group     BP 06/11/17 1131 (!) 158/64     Pulse Rate 06/11/17 1131 (!) 123     Resp 06/11/17 1131 (!) 26     Temp 06/11/17 1134 98.5 F (36.9 C)     Temp Source 06/11/17 1134 Oral     SpO2 06/11/17 1125 (!) 87 %     Weight --      Height --      Head Circumference --      Peak Flow --      Pain Score --      Pain Loc --      Pain Edu? --      Excl. in [place]? --    Constitutional: Alert and oriented. Well appearing and in no distress. Eyes: Normal exam ENT   Head: Normocephalic and atraumatic.   Mouth/Throat: Mucous membranes are moist. Cardiovascular: Regular rhythm, rate around 120 bpm. Respiratory: Moderate respiratory distress with moderate tachypnea bilateral wheezes with decreased air movement bilaterally. Gastrointestinal: Soft and nontender. No distention.   Musculoskeletal: Nontender with normal range of motion in all extremities. No lower extremity tenderness Neurologic:  Normal speech and language. No gross focal neurologic deficits Skin:  Skin is warm, dry and intact.  Psychiatric: Mood and affect are normal.   ____________________________________________    EKG  EKG shows sinus tachycardia at 134 bpm with a narrow QRS what appears to be normal axis, intervals are largely within normal limits with nonspecific ST changes.  Significant electrical interference due to respiratory distress and work of  breathing.  ____________________________________________    RADIOLOGY  Chest x-ray is normal  ____________________________________________   INITIAL IMPRESSION / ASSESSMENT AND PLAN / ED COURSE  Pertinent labs & imaging results that were available during my care of the patient were reviewed by me and considered in my medical decision making (see chart for details).  Patient presents to the emergency department with moderate respiratory distress.  On exam she has decreased air movement bilaterally with mild wheeze, but heavy increased work of breathing.  We will obtain a portable chest x-ray, treat with Solu-Medrol, duo nebs IV magnesium and continue to closely monitor.  Differential would include asthma exacerbation, undiagnosed COPD or CHF exacerbation, ACS, pneumonia, pneumothorax.  ----------------------------------------- 12:07 PM on 06/11/2017 -----------------------------------------  On recheck the patient is doing much better after breathing treatment she still has increased  work of breathing sitting upright in bed but wheeze has diminished.  Patient is able to speak in 2 word sentences.  ----------------------------------------- 1:35 PM on 06/11/2017 -----------------------------------------  Patient's labs are largely within normal limits besides a mild leukocytosis and mild elevation of BNP.  Chest x-ray is normal.  Patient does continue to have wheeze although improved from earlier.  She still has moderate tachypnea, I took the patient off oxygen and within several minutes she desatted to 86-87%.  We will place the patient back on oxygen and admit to the hospitalist service for further treatment.   ____________________________________________   FINAL CLINICAL IMPRESSION(S) / ED DIAGNOSES  Dyspnea Asthma exacerbation    Harvest Dark, MD 06/11/17 1336

## 2017-06-11 NOTE — Progress Notes (Addendum)
Date: 06/11/2017,   MRN# [id number] Erika Finley 07-07-1936 Code Status:     Code Status Orders  (From admission, onward)        Start     Ordered   06/11/17 1415  Full code  Continuous     06/11/17 1415    Code Status History    Date Active Date Inactive Code Status Order ID Comments User Context   04/25/2017 05:22 04/28/2017 17:55 Full Code [id number]  Harrie Foreman, MD ED   04/02/2017 10:19 04/03/2017 16:56 Full Code [id number]  Vaughan Basta, MD ED    Advance Directive Documentation     Most Recent Value  Type of Advance Directive  Living will, Healthcare Power of Attorney  Pre-existing out of facility DNR order (yellow form or pink MOST form)  No data  "MOST" Form in Place?  No data     Hosp day:@LENGTHOFSTAYDAYS @ Referring MD: @ATDPROV @     . CC: Recurrent bronchitic flares   HPI: This is a 81 year old who came in c/o shortness of breath, coughing, wheezing and sats at 84 % on room air. . Of note she has been here 2 times with like symptoms since September. Her base pulmonary meds dulera, singulair and duo neb. There is no associated chest pain, calf pain, recent travel, ? Past hx of asthma, she never smoked.  The recent echo is normal. No obvious mold in the house, no pets, except at thanksgiving, family member brought a dog. She denies gerd or significant post nasal drainage. No recent travel.    PMHX:   Past Medical History:  Diagnosis Date  . Asthma   . Cancer ([place]) 1970   skin   Surgical Hx:  Past Surgical History:  Procedure Laterality Date  . APPENDECTOMY    . TONSILLECTOMY AND ADENOIDECTOMY     Family Hx:  Family History  Problem Relation Age of Onset  . Valvular heart disease Brother   . Breast cancer Neg Hx    Social Hx:   Social History   Tobacco Use  . Smoking status: Never Smoker  . Smokeless tobacco: Never Used  Substance Use Topics  . Alcohol use: Yes    Alcohol/week: 1.2 oz    Types: 2 Glasses of wine per week    Comment:  1/4 glass before dinner  . Drug use: No   Medication:    Home Medication:    Current Medication: @CURMEDTAB @   Allergies:  Sulfa antibiotics and Penicillins  Review of Systems: Gen:  Denies  fever, sweats, chills HEENT: Denies blurred vision, double vision, ear pain, eye pain, hearing loss, nose bleeds, sore throat Cvc:  No dizziness, chest pain or heaviness Resp:  No wheezing now, no sob, no chest tightness  Gi: Denies swallowing difficulty, stomach pain, nausea or vomiting, diarrhea, constipation, bowel incontinence Gu:  Denies bladder incontinence, burning urine Ext:   No Joint pain, stiffness or swelling Skin: No skin rash, easy bruising or bleeding or hives Endoc:  No polyuria, polydipsia , polyphagia or weight change Psych: No depression, insomnia or hallucinations  Other:  All other systems negative, no hx c/w sleep apnea  Physical Examination:   VS: BP (!) 149/80   Pulse (!) 123   Temp 98.5 F (36.9 C) (Oral)   Resp (!) 24   Ht 5\' 5"  (1.651 m)   Wt 192 lb (87.1 kg)   SpO2 94%   BMI 31.95 kg/m   General Appearance: No distress, no audible wheezing Neuro:  without focal findings, mental status, speech normal, alert and oriented, cranial nerves 2-12 intact, reflexes normal and symmetric, sensation grossly normal  HEENT: PERRLA, EOM intact, no ptosis, no other lesions noticed NECK: No stridor, no jvd, no nodes: Pulmonary:.No wheezing, No rales  No sputum Production:   Cardiovascular:  Normal S1,S2.  No m/r/g.  Abdominal aorta pulsation normal.    Abdomen:Benign, Soft, non-tender, No masses, hepatosplenomegaly, No lymphadenopathy Endoc: No evident thyromegaly, no signs of acromegaly or Cushing features Skin:   warm, no rashes, no ecchymosis  Extremities: normal, no cyanosis, clubbing, no edema, warm with normal capillary refill.    Labs results:   Recent Labs    06/11/17 1129  HGB 14.8  HCT 45.2  MCV 90.7  WBC 13.2*  BUN 16  CREATININE 0.68  GLUCOSE 126*   CALCIUM 9.6  ,     Rad results:   Dg Chest Portable 1 View  Result Date: 06/11/2017 CLINICAL DATA:  Shortness of breath and wheezing EXAM: PORTABLE CHEST 1 VIEW COMPARISON:  April 27, 2017 FINDINGS: There is no edema or consolidation. Heart size and pulmonary vascularity are normal. No adenopathy. No evident bone lesions. IMPRESSION: No edema or consolidation. Electronically Signed   By: Lowella Grip III M.D.   On: 06/11/2017 12:00   Assessment and Plan: Hx thus far suggest recurrent asthmatic bronchitis, no obvious triggers. Presently she is back to baseline. Anticipate home soon ( tomorrow)   Will continue dulera, duo neb,  singulair 10 mg q hs Pred taper, followed by prednisone 10 mg q day for 2-3 weeks Check ige and eosinophil count Prn o2 Out patient pfts Follow up with pulmonary one week post d/c Reassess in the am    I have personally obtained a history, examined the patient, evaluated laboratory and imaging results, formulated the assessment and plan and placed orders.  The Patient requires high complexity decision making for assessment and support, frequent evaluation and titration of therapies, application of advanced monitoring technologies and extensive interpretation of multiple databases.   Elliot Meldrum,M.D. Pulmonary & Critical care Medicine [facility]

## 2017-06-12 LAB — CBC
HEMATOCRIT: 39.5 % (ref 35.0–47.0)
Hemoglobin: 13.1 g/dL (ref 12.0–16.0)
MCH: 30 pg (ref 26.0–34.0)
MCHC: 33.3 g/dL (ref 32.0–36.0)
MCV: 90.2 fL (ref 80.0–100.0)
Platelets: 253 10*3/uL (ref 150–440)
RBC: 4.38 MIL/uL (ref 3.80–5.20)
RDW: 14.7 % — AB (ref 11.5–14.5)
WBC: 17.4 10*3/uL — AB (ref 3.6–11.0)

## 2017-06-12 LAB — BASIC METABOLIC PANEL
Anion gap: 9 (ref 5–15)
BUN: 21 mg/dL — AB (ref 6–20)
CHLORIDE: 103 mmol/L (ref 101–111)
CO2: 26 mmol/L (ref 22–32)
Calcium: 9 mg/dL (ref 8.9–10.3)
Creatinine, Ser: 0.75 mg/dL (ref 0.44–1.00)
GFR calc Af Amer: >60 mL/min (ref >60)
GFR calc non Af Amer: >60 mL/min (ref >60)
GLUCOSE: 161 mg/dL — AB (ref 65–99)
POTASSIUM: 4.3 mmol/L (ref 3.5–5.1)
Sodium: 138 mmol/L (ref 135–145)

## 2017-06-12 MED ORDER — PREDNISONE 50 MG PO TABS: 50.0000 mg | ORAL_TABLET | Freq: Every day | ORAL | 0 refills | Status: AC

## 2017-06-12 MED ORDER — AZITHROMYCIN 250 MG PO TABS: ORAL_TABLET | ORAL | 0 refills | Status: AC

## 2017-06-12 NOTE — Progress Notes (Signed)
Oxygen removed, will recheck room air sat in a bit.

## 2017-06-12 NOTE — Plan of Care (Signed)
  Progressing Clinical Measurements: Will remain free from infection 06/12/2017 0829 - Progressing by Etheleen Nicks, RN   Not Progressing Clinical Measurements: Respiratory complications will improve 06/12/2017 4128 - Not Progressing by Etheleen Nicks, RN Note Remains on acute oxygen at 2L, expiratory wheezes bil, receiving IV steroids

## 2017-06-12 NOTE — Progress Notes (Signed)
[facility] [place] NAME: Erika Finley    MR#:  [id number]  DATE OF BIRTH:  [birth year]  SUBJECTIVE:   Feeling better this am  REVIEW OF SYSTEMS:    Review of Systems  Constitutional: Negative for fever, chills weight loss HENT: Negative for ear pain, nosebleeds, congestion, facial swelling, rhinorrhea, neck pain, neck stiffness and ear discharge.   Respiratory:++ cough, denies shortness of breath, wheezing  Cardiovascular: Negative for chest pain, palpitations and leg swelling.  Gastrointestinal: Negative for heartburn, abdominal pain, vomiting, diarrhea or consitpation Genitourinary: Negative for dysuria, urgency, frequency, hematuria Musculoskeletal: Negative for back pain or joint pain Neurological: Negative for dizziness, seizures, syncope, focal weakness,  numbness and headaches.  Hematological: Does not bruise/bleed easily.  Psychiatric/Behavioral: Negative for hallucinations, confusion, dysphoric mood    Tolerating Diet: yes      DRUG ALLERGIES:   Allergies  Allergen Reactions  . Sulfa Antibiotics Nausea Only  . Penicillins Rash    Has patient had a PCN reaction causing immediate rash, facial/tongue/throat swelling, SOB or lightheadedness with hypotension: Unknown Has patient had a PCN reaction causing severe rash involving mucus membranes or skin necrosis: Unknown Has patient had a PCN reaction that required hospitalization: Unknown Has patient had a PCN reaction occurring within the last 10 years: Unknown If all of the above answers are "NO", then may proceed with Cephalosporin use.     VITALS:  Blood pressure (!) 141/71, pulse 89, temperature 97.9 F (36.6 C), temperature source Oral, resp. rate 17, height 5\' 5"  (1.651 m), weight 86.6 kg (191 lb), SpO2 92 %.  PHYSICAL EXAMINATION:  Constitutional: Appears well-developed and well-nourished. No distress. HENT: Normocephalic. Marland Kitchen Oropharynx is clear and moist.   Eyes: Conjunctivae and EOM are normal. PERRLA, no scleral icterus.  Neck: Normal ROM. Neck supple. No JVD. No tracheal deviation. CVS: RRR, S1/S2 +, no murmurs, no gallops, no carotid bruit.  Pulmonary: Effort and breath sounds normal, no stridor, rhonchi, wheezes, rales.  Abdominal: Soft. BS +,  no distension, tenderness, rebound or guarding.  Musculoskeletal: Normal range of motion. No edema and no tenderness.  Neuro: Alert. CN 2-12 grossly intact. No focal deficits. Skin: Skin is warm and dry. No rash noted. Psychiatric: Normal mood and affect.      LABORATORY PANEL:   CBC Recent Labs  Lab 06/12/17 0410  WBC 17.4*  HGB 13.1  HCT 39.5  PLT 253   ------------------------------------------------------------------------------------------------------------------  Chemistries  Recent Labs  Lab 06/11/17 1129 06/12/17 0410  NA 140 138  K 3.9 4.3  CL 102 103  CO2 27 26  GLUCOSE 126* 161*  BUN 16 21*  CREATININE 0.68 0.75  CALCIUM 9.6 9.0  AST 30  --   ALT 16  --   ALKPHOS 93  --   BILITOT 0.6  --    ------------------------------------------------------------------------------------------------------------------  Cardiac Enzymes Recent Labs  Lab 06/11/17 1129  TROPONINI <0.03   ------------------------------------------------------------------------------------------------------------------  RADIOLOGY:  Dg Chest Portable 1 View  Result Date: 06/11/2017 CLINICAL DATA:  Shortness of breath and wheezing EXAM: PORTABLE CHEST 1 VIEW COMPARISON:  April 27, 2017 FINDINGS: There is no edema or consolidation. Heart size and pulmonary vascularity are normal. No adenopathy. No evident bone lesions. IMPRESSION: No edema or consolidation. Electronically Signed   By: Lowella Grip III M.D.   On: 06/11/2017 12:00     ASSESSMENT AND PLAN:   81 year old female with a history of intermittent asthma presents or shortness of breath and wheezing.  1. Acute hypoxic  respiratory failure in the setting of asthma exacerbation  2. Acute asthma exacerbation with acute bronchitis: Patient is a value by pulmonary. She was started IV steroids. She will need to continue her outpatient inhalers in addition to prednisone for 5 days and Z-Pak for acute bronchitis. She'll follow up with pulmonary in 2 weeks.  3. Sinus tachycardia due to respiratory distress and albuterol This has improved         Management plans discussed with the patient and she is in agreement.  CODE STATUS: full  TOTAL TIME TAKING CARE OF THIS PATIENT: 30 minutes.     POSSIBLE D/C today, DEPENDING ON CLINICAL CONDITION.   Chonte Ricke M.D on 06/12/2017 at 9:53 AM  Between 7am to 6pm - Pager - [phone] After 6pm go to [email or web address] - password [email or web address] [facility]  Office  [phone]  CC: Primary care physician; Rusty Aus, MD  Note: This dictation was prepared with Dragon dictation along with smaller phrase technology. Any transcriptional errors that result from this process are unintentional.

## 2017-06-12 NOTE — Care Management (Signed)
Patient has qualified for home oxygen but continues to verbalize she does not want to go home with oxygen and that her doctors that know her says she does not need oxygen.  She has now agreed not to discharge and remain inpatient while her asthmatic bronchitis continues to be treated in hopes of improving her respiratory status.  She has bee informed by attending she is going to need oxygen at discharge but "I don't believe I need it."

## 2017-06-12 NOTE — Progress Notes (Signed)
Pt complaining of throat and nose being dry.  Humidification applied to oxygen. Will monitor.

## 2017-06-12 NOTE — Discharge Summary (Signed)
[facility] [place] NAME: Erika Finley    MR#:  [id number]  DATE OF BIRTH:  11/01/1935  DATE OF ADMISSION:  06/11/2017 ADMITTING PHYSICIAN: Hillary Bow, MD  DATE OF DISCHARGE: 06/12/2017  PRIMARY CARE PHYSICIAN: Rusty Aus, MD    ADMISSION DIAGNOSIS:  Moderate persistent asthma with exacerbation [J45.41]  DISCHARGE DIAGNOSIS:  Active Problems:   Acute bronchitis   SECONDARY DIAGNOSIS:   Past Medical History:  Diagnosis Date  . Asthma   . Cancer ([place]) 1970   skin    HOSPITAL COURSE:   81 year old female with a history of intermittent asthma presents or shortness of breath and wheezing.  1. Acute hypoxic respiratory failure in the setting of asthma exacerbation  2. Acute asthma exacerbation with acute bronchitis: Patient is a value by pulmonary. She was started IV steroids. She will need to continue her outpatient inhalers in addition to prednisone for 5 days and Z-Pak for acute bronchitis. She'll follow up with pulmonary in 2 weeks.  3. Sinus tachycardia due to respiratory distress and albuterol This has improved   DISCHARGE CONDITIONS AND DIET:   Stable for discharge on regular diet  CONSULTS OBTAINED:    DRUG ALLERGIES:   Allergies  Allergen Reactions  . Sulfa Antibiotics Nausea Only  . Penicillins Rash    Has patient had a PCN reaction causing immediate rash, facial/tongue/throat swelling, SOB or lightheadedness with hypotension: Unknown Has patient had a PCN reaction causing severe rash involving mucus membranes or skin necrosis: Unknown Has patient had a PCN reaction that required hospitalization: Unknown Has patient had a PCN reaction occurring within the last 10 years: Unknown If all of the above answers are "NO", then may proceed with Cephalosporin use.     DISCHARGE MEDICATIONS:   Allergies as of 06/12/2017      Reactions   Sulfa Antibiotics Nausea Only   Penicillins Rash   Has patient had a  PCN reaction causing immediate rash, facial/tongue/throat swelling, SOB or lightheadedness with hypotension: Unknown Has patient had a PCN reaction causing severe rash involving mucus membranes or skin necrosis: Unknown Has patient had a PCN reaction that required hospitalization: Unknown Has patient had a PCN reaction occurring within the last 10 years: Unknown If all of the above answers are "NO", then may proceed with Cephalosporin use.      Medication List    STOP taking these medications   benzonatate 100 MG capsule Commonly known as:  TESSALON   chlorpheniramine-HYDROcodone 10-8 MG/5ML Suer Commonly known as:  TUSSIONEX   doxycycline 100 MG tablet Commonly known as:  VIBRA-TABS   SYMBICORT 160-4.5 MCG/ACT inhaler Generic drug:  budesonide-formoterol     TAKE these medications   albuterol 108 (90 Base) MCG/ACT inhaler Commonly known as:  PROVENTIL HFA;VENTOLIN HFA Inhale 2 puffs into the lungs every 6 (six) hours as needed for wheezing or shortness of breath.   aspirin EC 81 MG tablet Take 81 mg by mouth 3 (three) times a week.   azithromycin 250 MG tablet Commonly known as:  ZITHROMAX Z-PAK Take 2 tablets (500 mg) on  Day 1,  followed by 1 tablet (250 mg) once daily on Days 2 through 5.   Calcium-Vitamin D 600-200 MG-UNIT tablet Take 1 tablet by mouth daily.   fexofenadine 180 MG tablet Commonly known as:  ALLEGRA Take 180 mg by mouth daily.   ipratropium-albuterol 0.5-2.5 (3) MG/3ML Soln Commonly known as:  DUONEB Take 3 mLs by nebulization every 6 (  six) hours as needed.   mometasone-formoterol 200-5 MCG/ACT Aero Commonly known as:  DULERA Inhale 2 puffs into the lungs 2 (two) times daily.   montelukast 10 MG tablet Commonly known as:  SINGULAIR Take 10 mg by mouth at bedtime.   MULTI-VITAMINS Tabs Take 1 tablet by mouth daily.   pantoprazole 40 MG tablet Commonly known as:  PROTONIX Take 1 tablet by mouth daily.   predniSONE 50 MG tablet Commonly  known as:  DELTASONE Take 1 tablet (50 mg total) by mouth daily with breakfast for 5 days. What changed:    medication strength  how much to take  when to take this  additional instructions            Durable Medical Equipment  (From admission, onward)        Start     Ordered   06/12/17 0949  DME Oxygen  Once    Question Answer Comment  Mode or (Route) Nasal cannula   Liters per Minute 2   Frequency Continuous (stationary and portable oxygen unit needed)   Oxygen conserving device Yes   Oxygen delivery system Gas      06/12/17 0949        Today   CHIEF COMPLAINT:   Patient feels much better. Denies wheezing this morning. Shortness of breath has improved.   VITAL SIGNS:  Blood pressure (!) 141/71, pulse 89, temperature 97.9 F (36.6 C), temperature source Oral, resp. rate 17, height 5\' 5"  (1.651 m), weight 86.6 kg (191 lb), SpO2 92 %.   REVIEW OF SYSTEMS:  Review of Systems  Constitutional: Negative.  Negative for chills, fever and malaise/fatigue.  HENT: Negative.  Negative for ear discharge, ear pain, hearing loss, nosebleeds and sore throat.   Eyes: Negative.  Negative for blurred vision and pain.  Respiratory: Positive for cough. Negative for hemoptysis, shortness of breath and wheezing.   Cardiovascular: Negative.  Negative for chest pain, palpitations and leg swelling.  Gastrointestinal: Negative.  Negative for abdominal pain, blood in stool, diarrhea, nausea and vomiting.  Genitourinary: Negative.  Negative for dysuria.  Musculoskeletal: Negative.  Negative for back pain.  Skin: Negative.   Neurological: Negative for dizziness, tremors, speech change, focal weakness, seizures and headaches.  Endo/Heme/Allergies: Negative.  Does not bruise/bleed easily.  Psychiatric/Behavioral: Negative.  Negative for depression, hallucinations and suicidal ideas.     PHYSICAL EXAMINATION:  GENERAL:  81 y.o.-year-old patient lying in the bed with no acute  distress.  NECK:  Supple, no jugular venous distention. No thyroid enlargement, no tenderness.  LUNGS: Normal breath sounds bilaterally, no wheezing, rales,rhonchi  No use of accessory muscles of respiration.  CARDIOVASCULAR: S1, S2 normal. No murmurs, rubs, or gallops.  ABDOMEN: Soft, non-tender, non-distended. Bowel sounds present. No organomegaly or mass.  EXTREMITIES: No pedal edema, cyanosis, or clubbing.  PSYCHIATRIC: The patient is alert and oriented x 3.  SKIN: No obvious rash, lesion, or ulcer.   DATA REVIEW:   CBC Recent Labs  Lab 06/12/17 0410  WBC 17.4*  HGB 13.1  HCT 39.5  PLT 253    Chemistries  Recent Labs  Lab 06/11/17 1129 06/12/17 0410  NA 140 138  K 3.9 4.3  CL 102 103  CO2 27 26  GLUCOSE 126* 161*  BUN 16 21*  CREATININE 0.68 0.75  CALCIUM 9.6 9.0  AST 30  --   ALT 16  --   ALKPHOS 93  --   BILITOT 0.6  --     Cardiac  Enzymes Recent Labs  Lab 06/11/17 1129  TROPONINI <0.03    Microbiology Results  @[email or web address] @  RADIOLOGY:  Dg Chest Portable 1 View  Result Date: 06/11/2017 CLINICAL DATA:  Shortness of breath and wheezing EXAM: PORTABLE CHEST 1 VIEW COMPARISON:  April 27, 2017 FINDINGS: There is no edema or consolidation. Heart size and pulmonary vascularity are normal. No adenopathy. No evident bone lesions. IMPRESSION: No edema or consolidation. Electronically Signed   By: Lowella Grip III M.D.   On: 06/11/2017 12:00      Allergies as of 06/12/2017      Reactions   Sulfa Antibiotics Nausea Only   Penicillins Rash   Has patient had a PCN reaction causing immediate rash, facial/tongue/throat swelling, SOB or lightheadedness with hypotension: Unknown Has patient had a PCN reaction causing severe rash involving mucus membranes or skin necrosis: Unknown Has patient had a PCN reaction that required hospitalization: Unknown Has patient had a PCN reaction occurring within the last 10 years: Unknown If all of the above answers are  "NO", then may proceed with Cephalosporin use.      Medication List    STOP taking these medications   benzonatate 100 MG capsule Commonly known as:  TESSALON   chlorpheniramine-HYDROcodone 10-8 MG/5ML Suer Commonly known as:  TUSSIONEX   doxycycline 100 MG tablet Commonly known as:  VIBRA-TABS   SYMBICORT 160-4.5 MCG/ACT inhaler Generic drug:  budesonide-formoterol     TAKE these medications   albuterol 108 (90 Base) MCG/ACT inhaler Commonly known as:  PROVENTIL HFA;VENTOLIN HFA Inhale 2 puffs into the lungs every 6 (six) hours as needed for wheezing or shortness of breath.   aspirin EC 81 MG tablet Take 81 mg by mouth 3 (three) times a week.   azithromycin 250 MG tablet Commonly known as:  ZITHROMAX Z-PAK Take 2 tablets (500 mg) on  Day 1,  followed by 1 tablet (250 mg) once daily on Days 2 through 5.   Calcium-Vitamin D 600-200 MG-UNIT tablet Take 1 tablet by mouth daily.   fexofenadine 180 MG tablet Commonly known as:  ALLEGRA Take 180 mg by mouth daily.   ipratropium-albuterol 0.5-2.5 (3) MG/3ML Soln Commonly known as:  DUONEB Take 3 mLs by nebulization every 6 (six) hours as needed.   mometasone-formoterol 200-5 MCG/ACT Aero Commonly known as:  DULERA Inhale 2 puffs into the lungs 2 (two) times daily.   montelukast 10 MG tablet Commonly known as:  SINGULAIR Take 10 mg by mouth at bedtime.   MULTI-VITAMINS Tabs Take 1 tablet by mouth daily.   pantoprazole 40 MG tablet Commonly known as:  PROTONIX Take 1 tablet by mouth daily.   predniSONE 50 MG tablet Commonly known as:  DELTASONE Take 1 tablet (50 mg total) by mouth daily with breakfast for 5 days. What changed:    medication strength  how much to take  when to take this  additional instructions            Durable Medical Equipment  (From admission, onward)        Start     Ordered   06/12/17 0949  DME Oxygen  Once    Question Answer Comment  Mode or (Route) Nasal cannula    Liters per Minute 2   Frequency Continuous (stationary and portable oxygen unit needed)   Oxygen conserving device Yes   Oxygen delivery system Gas      06/12/17 0949        Management plans discussed with the patient and  she is in agreement. Stable for discharge home  Patient should follow up with pcp  CODE STATUS:     Code Status Orders  (From admission, onward)        Start     Ordered   06/11/17 1415  Full code  Continuous     06/11/17 1415    Code Status History    Date Active Date Inactive Code Status Order ID Comments User Context   04/25/2017 05:22 04/28/2017 17:55 Full Code [id number]  Harrie Foreman, MD ED   04/02/2017 10:19 04/03/2017 16:56 Full Code [id number]  Vaughan Basta, MD ED    Advance Directive Documentation     Most Recent Value  Type of Advance Directive  Living will, Healthcare Power of Attorney  Pre-existing out of facility DNR order (yellow form or pink MOST form)  No data  "MOST" Form in Place?  No data      TOTAL TIME TAKING CARE OF THIS PATIENT: 37 minutes.    Note: This dictation was prepared with Dragon dictation along with smaller phrase technology. Any transcriptional errors that result from this process are unintentional.  Tonette Koehne M.D on 06/12/2017 at 9:52 AM  Between 7am to 6pm - Pager - [phone] After 6pm go to [email or web address] - password [email or web address] [facility]  Office  [phone]  CC: Primary care physician; Rusty Aus, MD

## 2017-06-12 NOTE — Progress Notes (Addendum)
SATURATION QUALIFICATIONS: (This note is used to comply with regulatory documentation for home oxygen)  Patient Saturations on Room Air at Rest = 86%  Patient Saturations on Room Air while Ambulating = 81%  Patient Saturations on Liters of oxygen while Ambulating 4 %  Please briefly explain why patient needs home oxygen: 4 L Oxygen needed to maintain oxygen saturation of 91 while ambulating

## 2017-06-13 ENCOUNTER — Encounter: Payer: Self-pay | Admitting: Internal Medicine

## 2017-06-13 NOTE — Plan of Care (Signed)
  Adequate for Discharge Education: Knowledge of General Education information will improve 06/13/2017 1212 - Adequate for Discharge by Feliberto Gottron, RN Health Behavior/Discharge Planning: Ability to manage health-related needs will improve 06/13/2017 1212 - Adequate for Discharge by Feliberto Gottron, RN Clinical Measurements: Ability to maintain clinical measurements within normal limits will improve 06/13/2017 1212 - Adequate for Discharge by Feliberto Gottron, RN Will remain free from infection 06/13/2017 1212 - Adequate for Discharge by Feliberto Gottron, RN Diagnostic test results will improve 06/13/2017 1212 - Adequate for Discharge by Feliberto Gottron, RN Respiratory complications will improve 06/13/2017 1212 - Adequate for Discharge by Feliberto Gottron, RN Cardiovascular complication will be avoided 06/13/2017 1212 - Adequate for Discharge by Feliberto Gottron, RN Activity: Risk for activity intolerance will decrease 06/13/2017 1212 - Adequate for Discharge by Feliberto Gottron, RN Coping: Level of anxiety will decrease 06/13/2017 1212 - Adequate for Discharge by Feliberto Gottron, RN

## 2017-06-13 NOTE — Discharge Summary (Addendum)
[facility] NAME: Erika Finley    MR#:  [id number]  DATE OF BIRTH:  Jan 02, 1936  DATE OF ADMISSION:  06/11/2017 ADMITTING PHYSICIAN: Hillary Bow, MD  DATE OF DISCHARGE: 06/13/2017  PRIMARY CARE PHYSICIAN: Rusty Aus, MD    ADMISSION DIAGNOSIS:  Moderate persistent asthma with exacerbation [J45.41]  DISCHARGE DIAGNOSIS:   Acute bronchitis with acute on chronic Asthma exacerbation Hypoxia SECONDARY DIAGNOSIS:   Past Medical History:  Diagnosis Date  . Asthma   . Cancer ([place]) 1970   skin    HOSPITAL COURSE:   81 year old female with a history of intermittent asthma presents or shortness of breath and wheezing.  1. Acute hypoxic respiratory failure in the setting of acute on chronic asthma exacerbation  2. Acute on chronic asthma exacerbation with acute bronchitis: Patient is evaluated by pulmonary Dr Raul Del.  -She was started IV steroids. She will need to continue her outpatient inhalers in addition to prednisone for 5 days and Z-Pak for acute bronchitis. She'll follow up with pulmonary in 2 weeks. -pt does qualify for home oxygen  3. Sinus tachycardia due to respiratory distress and albuterol This has improved  Pt will be set up with oxygen prior to d/c   DISCHARGE CONDITIONS AND DIET:   Stable for discharge on regular diet  CONSULTS OBTAINED:    DRUG ALLERGIES:   Allergies  Allergen Reactions  . Sulfa Antibiotics Nausea Only  . Penicillins Rash    Has patient had a PCN reaction causing immediate rash, facial/tongue/throat swelling, SOB or lightheadedness with hypotension: Unknown Has patient had a PCN reaction causing severe rash involving mucus membranes or skin necrosis: Unknown Has patient had a PCN reaction that required hospitalization: Unknown Has patient had a PCN reaction occurring within the last 10 years: Unknown If all of the above answers are "NO", then may proceed with Cephalosporin  use.     DISCHARGE MEDICATIONS:   Allergies as of 06/13/2017      Reactions   Sulfa Antibiotics Nausea Only   Penicillins Rash   Has patient had a PCN reaction causing immediate rash, facial/tongue/throat swelling, SOB or lightheadedness with hypotension: Unknown Has patient had a PCN reaction causing severe rash involving mucus membranes or skin necrosis: Unknown Has patient had a PCN reaction that required hospitalization: Unknown Has patient had a PCN reaction occurring within the last 10 years: Unknown If all of the above answers are "NO", then may proceed with Cephalosporin use.      Medication List    STOP taking these medications   benzonatate 100 MG capsule Commonly known as:  TESSALON   chlorpheniramine-HYDROcodone 10-8 MG/5ML Suer Commonly known as:  TUSSIONEX   doxycycline 100 MG tablet Commonly known as:  VIBRA-TABS   SYMBICORT 160-4.5 MCG/ACT inhaler Generic drug:  budesonide-formoterol     TAKE these medications   albuterol 108 (90 Base) MCG/ACT inhaler Commonly known as:  PROVENTIL HFA;VENTOLIN HFA Inhale 2 puffs into the lungs every 6 (six) hours as needed for wheezing or shortness of breath.   aspirin EC 81 MG tablet Take 81 mg by mouth 3 (three) times a week.   azithromycin 250 MG tablet Commonly known as:  ZITHROMAX Z-PAK Take 2 tablets (500 mg) on  Day 1,  followed by 1 tablet (250 mg) once daily on Days 2 through 5.   Calcium-Vitamin D 600-200 MG-UNIT tablet Take 1 tablet by mouth daily.   fexofenadine 180 MG tablet Commonly known as:  ALLEGRA Take 180 mg by mouth daily.   ipratropium-albuterol 0.5-2.5 (3) MG/3ML Soln Commonly known as:  DUONEB Take 3 mLs by nebulization every 6 (six) hours as needed.   mometasone-formoterol 200-5 MCG/ACT Aero Commonly known as:  DULERA Inhale 2 puffs into the lungs 2 (two) times daily.   montelukast 10 MG tablet Commonly known as:  SINGULAIR Take 10 mg by mouth at bedtime.   MULTI-VITAMINS  Tabs Take 1 tablet by mouth daily.   pantoprazole 40 MG tablet Commonly known as:  PROTONIX Take 1 tablet by mouth daily.   predniSONE 50 MG tablet Commonly known as:  DELTASONE Take 1 tablet (50 mg total) by mouth daily with breakfast for 5 days. What changed:    medication strength  how much to take  when to take this  additional instructions            Durable Medical Equipment  (From admission, onward)        Start     Ordered   06/12/17 0949  DME Oxygen  Once    Question Answer Comment  Mode or (Route) Nasal cannula   Liters per Minute 2   Frequency Continuous (stationary and portable oxygen unit needed)   Oxygen conserving device Yes   Oxygen delivery system Gas      06/12/17 0949        Today   CHIEF COMPLAINT:   Patient feels much better. Denies wheezing this morning. Shortness of breath has improved. Cough +  VITAL SIGNS:  Blood pressure (!) 144/76, pulse (!) 105, temperature 97.8 F (36.6 C), temperature source Oral, resp. rate 18, height 5\' 5"  (1.651 m), weight 87.3 kg (192 lb 7 oz), SpO2 93 %.   REVIEW OF SYSTEMS:  Review of Systems  Constitutional: Negative.  Negative for chills, fever and malaise/fatigue.  HENT: Negative.  Negative for ear discharge, ear pain, hearing loss, nosebleeds and sore throat.   Eyes: Negative.  Negative for blurred vision and pain.  Respiratory: Positive for cough. Negative for hemoptysis, shortness of breath and wheezing.   Cardiovascular: Negative.  Negative for chest pain, palpitations and leg swelling.  Gastrointestinal: Negative.  Negative for abdominal pain, blood in stool, diarrhea, nausea and vomiting.  Genitourinary: Negative.  Negative for dysuria.  Musculoskeletal: Negative.  Negative for back pain.  Skin: Negative.   Neurological: Negative for dizziness, tremors, speech change, focal weakness, seizures and headaches.  Endo/Heme/Allergies: Negative.  Does not bruise/bleed easily.   Psychiatric/Behavioral: Negative.  Negative for depression, hallucinations and suicidal ideas.     PHYSICAL EXAMINATION:  GENERAL:  81 y.o.-year-old patient lying in the bed with no acute distress.  NECK:  Supple, no jugular venous distention. No thyroid enlargement, no tenderness.  LUNGS: Normal breath sounds bilaterally, no wheezing, rales,rhonchi  No use of accessory muscles of respiration.  CARDIOVASCULAR: S1, S2 normal. No murmurs, rubs, or gallops.  ABDOMEN: Soft, non-tender, non-distended. Bowel sounds present. No organomegaly or mass.  EXTREMITIES: No pedal edema, cyanosis, or clubbing.  PSYCHIATRIC: The patient is alert and oriented x 3.  SKIN: No obvious rash, lesion, or ulcer.   DATA REVIEW:   CBC Recent Labs  Lab 06/12/17 0410  WBC 17.4*  HGB 13.1  HCT 39.5  PLT 253    Chemistries  Recent Labs  Lab 06/11/17 1129 06/12/17 0410  NA 140 138  K 3.9 4.3  CL 102 103  CO2 27 26  GLUCOSE 126* 161*  BUN 16 21*  CREATININE 0.68 0.75  CALCIUM  9.6 9.0  AST 30  --   ALT 16  --   ALKPHOS 93  --   BILITOT 0.6  --     Cardiac Enzymes Recent Labs  Lab 06/11/17 1129  TROPONINI <0.03    Microbiology Results  @[email or web address] @  RADIOLOGY:  Dg Chest Portable 1 View  Result Date: 06/11/2017 CLINICAL DATA:  Shortness of breath and wheezing EXAM: PORTABLE CHEST 1 VIEW COMPARISON:  April 27, 2017 FINDINGS: There is no edema or consolidation. Heart size and pulmonary vascularity are normal. No adenopathy. No evident bone lesions. IMPRESSION: No edema or consolidation. Electronically Signed   By: Lowella Grip III M.D.   On: 06/11/2017 12:00      Allergies as of 06/13/2017      Reactions   Sulfa Antibiotics Nausea Only   Penicillins Rash   Has patient had a PCN reaction causing immediate rash, facial/tongue/throat swelling, SOB or lightheadedness with hypotension: Unknown Has patient had a PCN reaction causing severe rash involving mucus membranes or skin  necrosis: Unknown Has patient had a PCN reaction that required hospitalization: Unknown Has patient had a PCN reaction occurring within the last 10 years: Unknown If all of the above answers are "NO", then may proceed with Cephalosporin use.      Medication List    STOP taking these medications   benzonatate 100 MG capsule Commonly known as:  TESSALON   chlorpheniramine-HYDROcodone 10-8 MG/5ML Suer Commonly known as:  TUSSIONEX   doxycycline 100 MG tablet Commonly known as:  VIBRA-TABS   SYMBICORT 160-4.5 MCG/ACT inhaler Generic drug:  budesonide-formoterol     TAKE these medications   albuterol 108 (90 Base) MCG/ACT inhaler Commonly known as:  PROVENTIL HFA;VENTOLIN HFA Inhale 2 puffs into the lungs every 6 (six) hours as needed for wheezing or shortness of breath.   aspirin EC 81 MG tablet Take 81 mg by mouth 3 (three) times a week.   azithromycin 250 MG tablet Commonly known as:  ZITHROMAX Z-PAK Take 2 tablets (500 mg) on  Day 1,  followed by 1 tablet (250 mg) once daily on Days 2 through 5.   Calcium-Vitamin D 600-200 MG-UNIT tablet Take 1 tablet by mouth daily.   fexofenadine 180 MG tablet Commonly known as:  ALLEGRA Take 180 mg by mouth daily.   ipratropium-albuterol 0.5-2.5 (3) MG/3ML Soln Commonly known as:  DUONEB Take 3 mLs by nebulization every 6 (six) hours as needed.   mometasone-formoterol 200-5 MCG/ACT Aero Commonly known as:  DULERA Inhale 2 puffs into the lungs 2 (two) times daily.   montelukast 10 MG tablet Commonly known as:  SINGULAIR Take 10 mg by mouth at bedtime.   MULTI-VITAMINS Tabs Take 1 tablet by mouth daily.   pantoprazole 40 MG tablet Commonly known as:  PROTONIX Take 1 tablet by mouth daily.   predniSONE 50 MG tablet Commonly known as:  DELTASONE Take 1 tablet (50 mg total) by mouth daily with breakfast for 5 days. What changed:    medication strength  how much to take  when to take this  additional instructions             Durable Medical Equipment  (From admission, onward)        Start     Ordered   06/12/17 0949  DME Oxygen  Once    Question Answer Comment  Mode or (Route) Nasal cannula   Liters per Minute 2   Frequency Continuous (stationary and portable oxygen unit needed)   Oxygen  conserving device Yes   Oxygen delivery system Gas      06/12/17 0949        Management plans discussed with the patient and she is in agreement. Stable for discharge home  Patient should follow up with pcp  CODE STATUS:     Code Status Orders  (From admission, onward)        Start     Ordered   06/11/17 1415  Full code  Continuous     06/11/17 1415    Code Status History    Date Active Date Inactive Code Status Order ID Comments User Context   04/25/2017 05:22 04/28/2017 17:55 Full Code [id number]  Harrie Foreman, MD ED   04/02/2017 10:19 04/03/2017 16:56 Full Code [id number]  Vaughan Basta, MD ED    Advance Directive Documentation     Most Recent Value  Type of Advance Directive  Living will, Healthcare Power of Attorney  Pre-existing out of facility DNR order (yellow form or pink MOST form)  No data  "MOST" Form in Place?  No data      TOTAL TIME TAKING CARE OF THIS PATIENT:40 minutes.    Note: This dictation was prepared with Dragon dictation along with smaller phrase technology. Any transcriptional errors that result from this process are unintentional.  Fritzi Mandes M.D on 06/13/2017 at 8:42 AM  Between 7am to 6pm - Pager - [phone] After 6pm go to [email or web address] - password [email or web address] [facility]  Office  [phone]  CC: Primary care physician; Rusty Aus, MD

## 2017-06-13 NOTE — Care Management Note (Signed)
Case Management Note  Patient Details  Name: ELLEANA STILLSON MRN: [id number] Date of Birth: 1936-03-01  Subjective/Objective:        Discussed discharge planning with Ms Flaim. She is agreeable to going home with new oxygen today. A referral to Melene Muller at [facility] was called and Brenton Grills will deliver a portable 02 tank to Ms Dadisman's [facility] room this morning, and a home 02 set up will be installed in her home later today.             Action/Plan:   Expected Discharge Date:  06/13/17               Expected Discharge Plan:  Home/Self Care  In-House Referral:     Discharge planning Services  CM Consult  Post Acute Care Choice:    Choice offered to:  Patient  DME Arranged:  Oxygen DME Agency:  [place]:  NA [place] Agency:  NA  Status of Service:  Completed, signed off  If discussed at [place] of Stay Meetings, dates discussed:    Additional Comments:  Kennadee Walthour A, RN 06/13/2017, 9:42 AM

## 2017-06-13 NOTE — Progress Notes (Signed)
Discharge instructions given, prescriptions give, IV out and tele off. Pt verbalized understanding. Pt will be transported via wheelchair.

## 2017-06-13 NOTE — Progress Notes (Signed)
Pt wheeled out via wheelchair  

## 2017-06-15 LAB — IGE: IgE (Immunoglobulin E), Serum: 10 IU/mL (ref 0–100)

## 2017-06-22 ENCOUNTER — Other Ambulatory Visit: Payer: Self-pay | Admitting: Unknown Physician Specialty

## 2017-06-22 DIAGNOSIS — R05 Cough: Secondary | ICD-10-CM

## 2017-06-22 DIAGNOSIS — R059 Cough, unspecified: Secondary | ICD-10-CM

## 2017-06-22 DIAGNOSIS — Z9189 Other specified personal risk factors, not elsewhere classified: Secondary | ICD-10-CM

## 2017-07-08 ENCOUNTER — Ambulatory Visit
Admission: RE | Admit: 2017-07-08 | Discharge: 2017-07-08 | Disposition: A | Discharge status: Home / Self Care | Payer: Medicare Other | Source: Ambulatory Visit | Attending: Unknown Physician Specialty | Admitting: Unknown Physician Specialty

## 2017-07-08 DIAGNOSIS — R1312 Dysphagia, oropharyngeal phase: Secondary | ICD-10-CM

## 2017-07-08 DIAGNOSIS — J209 Acute bronchitis, unspecified: Secondary | ICD-10-CM | POA: Insufficient documentation

## 2017-07-08 DIAGNOSIS — Z9189 Other specified personal risk factors, not elsewhere classified: Secondary | ICD-10-CM | POA: Diagnosis present

## 2017-07-08 DIAGNOSIS — R05 Cough: Secondary | ICD-10-CM

## 2017-07-08 DIAGNOSIS — R059 Cough, unspecified: Secondary | ICD-10-CM

## 2017-07-08 NOTE — Therapy (Signed)
[place], [place], [address] Phone: [phone]   Fax:     Modified Barium Swallow  Patient Details  Name: CATLYN SHIPTON MRN: [id number] Date of Birth: 1935-08-06 No Data Recorded  Encounter Date: 07/08/2017  End of Session - 07/08/17 1326    Visit Number  1    Number of Visits  1    Date for SLP Re-Evaluation  07/08/17    SLP Start Time  1247    SLP Stop Time   1327    SLP Time Calculation (min)  40 min    Activity Tolerance  Patient tolerated treatment well       Past Medical History:  Diagnosis Date  . Cancer ([place]) 1970   skin  . Chronic asthma     Past Surgical History:  Procedure Laterality Date  . APPENDECTOMY    . TONSILLECTOMY AND ADENOIDECTOMY      There were no vitals filed for this visit.    Subjective: Patient behavior: (alertness, ability to follow instructions, etc.): Patient able to verbalize swallowing history and follow directions.  Chief complaint: concern for aspiration secondary report of some coughing and choking after eating   Objective:  Radiological Procedure: A videoflouroscopic evaluation of oral-preparatory, reflex initiation, and pharyngeal phases of the swallow was performed; as well as a screening of the upper esophageal phase.  I. POSTURE: Upright in MBS chair  II. VIEW: Lateral  III. COMPENSATORY STRATEGIES: N/A  IV. BOLUSES ADMINISTERED:   Thin Liquid: 1 small, 3 rapid consecutive   Nectar-thick Liquid: 1 large   Honey-thick Liquid: DNT   Puree: 2 teaspoon presentations   Mechanical Soft: 1/4 graham cracker in applesauce  V. RESULTS OF EVALUATION: A. ORAL PREPARATORY PHASE: (The lips, tongue, and velum are observed for strength and coordination)       **Overall Severity Rating: Within normal limits  B. SWALLOW INITIATION/REFLEX: (The reflex is normal if "triggered" by the time the bolus reached the base of the tongue)  **Overall  Severity Rating: Mild; triggers while falling from the valleculae to the pyriform sinuses with thin liquid  C. PHARYNGEAL PHASE: (Pharyngeal function is normal if the bolus shows rapid, smooth, and continuous transit through the pharynx and there is no pharyngeal residue after the swallow)  **Overall Severity Rating: Within normal limits  D. LARYNGEAL PENETRATION: (Material entering into the laryngeal inlet/vestibule but not aspirated) None  E. ASPIRATION: None  F. ESOPHAGEAL PHASE: (Screening of the upper esophagus): In the cervical esophagus there is a finger-like protrusion along the posterior wall during swallow (does not impede flow of boluses) consistent with prominent cricopharyngeus.    ASSESSMENT:  PLAN/RECOMMENDATIONS:   A. Diet: Regular/ususal diet   B. Swallowing Precautions: No special precautions indicated   C. Recommended consultation to: follow up with MDs as recommended   D. Therapy recommendations: ST is not indicated   E. Results and recommendations were discussed with the patient immediately following the study and the final report routed to referring MD and PCP.    Dysphagia, oropharyngeal phase  Aspiration precautions - Plan: DG OP Swallowing Func-Medicare/Speech Path, DG OP Swallowing Func-Medicare/Speech Path  Cough - Plan: DG OP Swallowing Func-Medicare/Speech Path, DG OP Swallowing Func-Medicare/Speech Path        Problem List Patient Active Problem List   Diagnosis Date Noted  . Acute bronchitis 06/11/2017  . Asthma exacerbation 04/25/2017  . Chest pain 04/02/2017   Leroy Sea, MS/CCC- SLP  Valetta Fuller,  Susie 07/08/2017, 1:28 PM  [facility] [place], [place], [address] Phone: [phone]   Fax:     Name: IVAH GIRARDOT MRN: [id number] Date of Birth: Jul 25, 1935

## 2018-03-22 ENCOUNTER — Other Ambulatory Visit: Payer: Self-pay | Admitting: Internal Medicine

## 2018-03-22 DIAGNOSIS — Z1231 Encounter for screening mammogram for malignant neoplasm of breast: Secondary | ICD-10-CM

## 2018-04-14 ENCOUNTER — Ambulatory Visit
Admission: RE | Admit: 2018-04-14 | Discharge: 2018-04-14 | Disposition: A | Discharge status: Home / Self Care | Payer: Medicare Other | Source: Ambulatory Visit | Attending: Internal Medicine | Admitting: Internal Medicine

## 2018-04-14 DIAGNOSIS — Z1231 Encounter for screening mammogram for malignant neoplasm of breast: Secondary | ICD-10-CM | POA: Diagnosis present

## 2018-09-22 ENCOUNTER — Encounter: Payer: Self-pay | Admitting: *Deleted

## 2018-09-22 ENCOUNTER — Other Ambulatory Visit: Payer: Self-pay

## 2018-09-27 ENCOUNTER — Encounter
Admission: RE | Admit: 2018-09-27 | Discharge: 2018-09-27 | Disposition: A | Discharge status: Home / Self Care | Payer: Medicare Other | Source: Ambulatory Visit | Attending: Podiatry | Admitting: Podiatry

## 2018-09-27 ENCOUNTER — Other Ambulatory Visit: Payer: Self-pay | Admitting: Podiatry

## 2018-09-27 ENCOUNTER — Encounter: Payer: Self-pay | Admitting: Anesthesiology

## 2018-09-27 ENCOUNTER — Other Ambulatory Visit: Payer: Self-pay

## 2018-09-27 DIAGNOSIS — Z01818 Encounter for other preprocedural examination: Secondary | ICD-10-CM | POA: Insufficient documentation

## 2018-09-27 DIAGNOSIS — X58XXXA Exposure to other specified factors, initial encounter: Secondary | ICD-10-CM | POA: Diagnosis not present

## 2018-09-27 DIAGNOSIS — S93125A Dislocation of metatarsophalangeal joint of left lesser toe(s), initial encounter: Secondary | ICD-10-CM | POA: Diagnosis not present

## 2018-09-27 DIAGNOSIS — Z5309 Procedure and treatment not carried out because of other contraindication: Secondary | ICD-10-CM | POA: Diagnosis not present

## 2018-09-27 DIAGNOSIS — M2012 Hallux valgus (acquired), left foot: Secondary | ICD-10-CM | POA: Diagnosis not present

## 2018-09-27 DIAGNOSIS — M2042 Other hammer toe(s) (acquired), left foot: Secondary | ICD-10-CM | POA: Diagnosis not present

## 2018-09-27 DIAGNOSIS — M216X2 Other acquired deformities of left foot: Secondary | ICD-10-CM | POA: Diagnosis not present

## 2018-09-27 HISTORY — DX: Unspecified osteoarthritis, unspecified site: M19.90

## 2018-09-27 LAB — BASIC METABOLIC PANEL
ANION GAP: 11 (ref 5–15)
BUN: 18 mg/dL (ref 8–23)
CHLORIDE: 101 mmol/L (ref 98–111)
CO2: 26 mmol/L (ref 22–32)
Calcium: 9.9 mg/dL (ref 8.9–10.3)
Creatinine, Ser: 0.57 mg/dL (ref 0.44–1.00)
GFR calc non Af Amer: >60 mL/min (ref >60)
Glucose, Bld: 105 mg/dL — ABNORMAL HIGH (ref 70–99)
POTASSIUM: 4 mmol/L (ref 3.5–5.1)
Sodium: 138 mmol/L (ref 135–145)

## 2018-09-27 LAB — CBC
HEMATOCRIT: 45.6 % (ref 36.0–46.0)
HEMOGLOBIN: 14.8 g/dL (ref 12.0–15.0)
MCH: 29.2 pg (ref 26.0–34.0)
MCHC: 32.5 g/dL (ref 30.0–36.0)
MCV: 90.1 fL (ref 80.0–100.0)
Platelets: 236 10*3/uL (ref 150–400)
RBC: 5.06 MIL/uL (ref 3.87–5.11)
RDW: 14.8 % (ref 11.5–15.5)
WBC: 12.2 10*3/uL — ABNORMAL HIGH (ref 4.0–10.5)
nRBC: 0 % (ref 0.0–0.2)

## 2018-09-27 MED ORDER — CLINDAMYCIN PHOSPHATE 900 MG/50ML IV SOLN: 900.0000 mg | INTRAVENOUS | Status: DC

## 2018-09-27 NOTE — Patient Instructions (Addendum)
Your procedure is scheduled on: Tuesday 09/28/18.  Report to DAY SURGERY DEPARTMENT LOCATED ON 2ND FLOOR MEDICAL MALL ENTRANCE. To find out your arrival time please call [phone] between 1PM - 3PM on TODAY.   Remember: Instructions that are not followed completely may result in serious medical risk, up to and including death, or upon the discretion of your surgeon and anesthesiologist your surgery may need to be rescheduled.      _X__ 1. Do not eat food after midnight the night before your procedure.                 No gum chewing or hard candies. You may drink clear liquids up to 2 hours                 before you are scheduled to arrive for your surgery- DO NOT drink clear                 liquids within 2 hours of the start of your surgery.                 Clear Liquids include:  water, apple juice without pulp, clear carbohydrate                 drink such as Clearfast or Gatorade, Black Coffee or Tea (Do not add                 milk or creamer to coffee or tea).   __X__2.  On the morning of surgery brush your teeth with toothpaste and water, you may rinse your mouth with mouthwash if you wish.  Do not swallow any toothpaste or mouthwash.      _X__ 3.  No Alcohol for 24 hours before or after surgery.    __X__6.  Notify your doctor if there is any change in your medical condition      (cold, fever, infections).      Do not wear jewelry, make-up, hairpins, clips or nail polish. Do not wear lotions, powders, or perfumes.  Do not shave 48 hours prior to surgery. Men may shave face and neck. Do not bring valuables to the hospital.     [facility] is not responsible for any belongings or valuables.   Contacts, dentures/partials or body piercings may not be worn into surgery. Bring a case for your contacts, glasses or hearing aids, a denture cup will be supplied.    Patients discharged the day of surgery will not be allowed to drive home.    Please read over the following  fact sheets that you were given:   MRSA Information   __X__ Take these medicines the morning of surgery with A SIP OF WATER:     1. albuterol (PROVENTIL HFA;VENTOLIN HFA) 108 (90 Base) MCG/ACT inhaler  2. budesonide-formoterol (SYMBICORT) 160-4.5 MCG/ACT inhaler  3. diltiazem (CARDIZEM CD) 180 MG 24 hr capsule  4. fexofenadine (ALLEGRA) 180 MG tablet     __X__ Use SAGE wipes as directed   _ X___ Use inhalers on the day of surgery. Also bring the inhaler with you to the hospital on the morning of surgery.   __X__ Stop Blood Thinners: Aspirin. You have already stopped taking Aspirin.   __X__ Stop Anti-inflammatories 7 days before surgery such as Advil, Ibuprofen, Motrin, BC or Goodies Powder, Naprosyn, Naproxen, Aleve, Aspirin, Meloxicam. May take Tylenol if needed for pain or discomfort.    __X__ Don't start any new herbal supplements before your procedure.

## 2018-09-28 ENCOUNTER — Other Ambulatory Visit: Payer: Self-pay

## 2018-09-28 ENCOUNTER — Encounter
Admission: RE | Disposition: A | Discharge status: Home / Self Care | Payer: Self-pay | Source: Home / Self Care | Attending: Podiatry

## 2018-09-28 ENCOUNTER — Encounter: Payer: Self-pay | Admitting: Certified Registered Nurse Anesthetist

## 2018-09-28 ENCOUNTER — Ambulatory Visit
Admission: RE | Admit: 2018-09-28 | Discharge: 2018-09-28 | Disposition: A | Discharge status: Home / Self Care | Payer: Medicare Other | Attending: Podiatry | Admitting: Podiatry

## 2018-09-28 DIAGNOSIS — S93125A Dislocation of metatarsophalangeal joint of left lesser toe(s), initial encounter: Secondary | ICD-10-CM | POA: Insufficient documentation

## 2018-09-28 DIAGNOSIS — X58XXXA Exposure to other specified factors, initial encounter: Secondary | ICD-10-CM | POA: Insufficient documentation

## 2018-09-28 DIAGNOSIS — M216X2 Other acquired deformities of left foot: Secondary | ICD-10-CM | POA: Insufficient documentation

## 2018-09-28 DIAGNOSIS — M2012 Hallux valgus (acquired), left foot: Secondary | ICD-10-CM | POA: Insufficient documentation

## 2018-09-28 DIAGNOSIS — Z5309 Procedure and treatment not carried out because of other contraindication: Secondary | ICD-10-CM | POA: Insufficient documentation

## 2018-09-28 DIAGNOSIS — M2042 Other hammer toe(s) (acquired), left foot: Secondary | ICD-10-CM | POA: Insufficient documentation

## 2018-09-28 HISTORY — DX: Essential (primary) hypertension: I10

## 2018-09-28 LAB — CBC
HCT: 43 % (ref 36.0–46.0)
Hemoglobin: 13.7 g/dL (ref 12.0–15.0)
MCH: 29.3 pg (ref 26.0–34.0)
MCHC: 31.9 g/dL (ref 30.0–36.0)
MCV: 91.9 fL (ref 80.0–100.0)
Platelets: 224 10*3/uL (ref 150–400)
RBC: 4.68 MIL/uL (ref 3.87–5.11)
RDW: 14.8 % (ref 11.5–15.5)
WBC: 11.6 10*3/uL — ABNORMAL HIGH (ref 4.0–10.5)
nRBC: 0 % (ref 0.0–0.2)

## 2018-09-28 LAB — URIC ACID: Uric Acid, Serum: 7.5 mg/dL — ABNORMAL HIGH (ref 2.5–7.1)

## 2018-09-28 SURGERY — OSTECTOMY
Anesthesia: General | Laterality: Left

## 2018-09-28 MED ORDER — SUCCINYLCHOLINE CHLORIDE 20 MG/ML IJ SOLN
INTRAMUSCULAR | Status: AC
  Filled 2018-09-28: qty 1

## 2018-09-28 MED ORDER — LACTATED RINGERS IV SOLN: INTRAVENOUS | Status: DC

## 2018-09-28 MED ORDER — PROPOFOL 10 MG/ML IV BOLUS
INTRAVENOUS | Status: AC
  Filled 2018-09-28: qty 20

## 2018-09-28 MED ORDER — FAMOTIDINE 20 MG PO TABS
20.0000 mg | ORAL_TABLET | Freq: Once | ORAL | Status: AC
  Administered 2018-09-28: 20 mg via ORAL

## 2018-09-28 MED ORDER — MIDAZOLAM HCL 2 MG/2ML IJ SOLN
INTRAMUSCULAR | Status: AC
  Filled 2018-09-28: qty 2

## 2018-09-28 MED ORDER — CLINDAMYCIN PHOSPHATE 900 MG/50ML IV SOLN
INTRAVENOUS | Status: AC
  Filled 2018-09-28: qty 50

## 2018-09-28 MED ORDER — LIDOCAINE HCL (PF) 2 % IJ SOLN
INTRAMUSCULAR | Status: AC
  Filled 2018-09-28: qty 10

## 2018-09-28 MED ORDER — ROCURONIUM BROMIDE 50 MG/5ML IV SOLN
INTRAVENOUS | Status: AC
  Filled 2018-09-28: qty 1

## 2018-09-28 MED ORDER — POVIDONE-IODINE 7.5 % EX SOLN
Freq: Once | CUTANEOUS | Status: DC
  Filled 2018-09-28: qty 118

## 2018-09-28 MED ORDER — FENTANYL CITRATE (PF) 100 MCG/2ML IJ SOLN
INTRAMUSCULAR | Status: AC
  Filled 2018-09-28: qty 2

## 2018-09-28 MED ORDER — FAMOTIDINE 20 MG PO TABS
ORAL_TABLET | ORAL | Status: AC
  Administered 2018-09-28: 20 mg via ORAL
  Filled 2018-09-28: qty 1

## 2018-09-28 SURGICAL SUPPLY — 60 items
BAG COUNTER SPONGE EZ (MISCELLANEOUS) ×2 IMPLANT
BANDAGE ELASTIC 4 LF NS (GAUZE/BANDAGES/DRESSINGS) ×6 IMPLANT
BANDAGE ELASTIC 6 LF NS (GAUZE/BANDAGES/DRESSINGS) ×3 IMPLANT
BENZOIN TINCTURE PRP APPL 2/3 (GAUZE/BANDAGES/DRESSINGS) ×3 IMPLANT
BLADE MED AGGRESSIVE (BLADE) ×3 IMPLANT
BLADE OSC/SAGITTAL 5.5X25 (BLADE) ×3 IMPLANT
BLADE OSC/SAGITTAL MD 5.5X18 (BLADE) ×3 IMPLANT
BLADE OSC/SAGITTAL MD 9X18.5 (BLADE) ×3 IMPLANT
BLADE SURG 15 STRL LF DISP TIS (BLADE) ×2 IMPLANT
BLADE SURG 15 STRL SS (BLADE) ×4
BLADE SURG MINI STRL (BLADE) ×3 IMPLANT
BNDG CONFORM 2 STRL LF (GAUZE/BANDAGES/DRESSINGS) ×3 IMPLANT
BNDG CONFORM 3 STRL LF (GAUZE/BANDAGES/DRESSINGS) ×3 IMPLANT
BNDG ESMARK 4X12 TAN STRL LF (GAUZE/BANDAGES/DRESSINGS) ×3 IMPLANT
BNDG GAUZE 4.5X4.1 6PLY STRL (MISCELLANEOUS) ×3 IMPLANT
CANISTER SUCT 1200ML W/VALVE (MISCELLANEOUS) ×3 IMPLANT
CAST PADDING 3X4FT ST 30246 (SOFTGOODS) ×2
CLOSURE WOUND 1/4X4 (GAUZE/BANDAGES/DRESSINGS) ×1
COUNTER SPONGE BAG EZ (MISCELLANEOUS) ×1
COVER PIN YLW 0.028-062 (MISCELLANEOUS) ×12 IMPLANT
COVER WAND RF STERILE (DRAPES) ×3 IMPLANT
CUFF TOURN 18 STER (MISCELLANEOUS) ×3 IMPLANT
CUFF TOURN DUAL PL 12 NO SLV (MISCELLANEOUS) ×3 IMPLANT
DRAPE FLUOR MINI C-ARM 54X84 (DRAPES) ×3 IMPLANT
DRSG TEGADERM 4X4.75 (GAUZE/BANDAGES/DRESSINGS) ×3 IMPLANT
DURAPREP 26ML APPLICATOR (WOUND CARE) ×3 IMPLANT
ELECT REM PT RETURN 9FT ADLT (ELECTROSURGICAL) ×3
ELECTRODE REM PT RTRN 9FT ADLT (ELECTROSURGICAL) ×1 IMPLANT
GAUZE PETRO XEROFOAM 1X8 (MISCELLANEOUS) ×3 IMPLANT
GAUZE SPONGE 4X4 12PLY STRL (GAUZE/BANDAGES/DRESSINGS) ×3 IMPLANT
GLOVE BIO SURGEON STRL SZ8 (GLOVE) ×3 IMPLANT
GLOVE INDICATOR 8.0 STRL GRN (GLOVE) ×3 IMPLANT
GOWN STRL REUS W/ TWL LRG LVL3 (GOWN DISPOSABLE) ×2 IMPLANT
GOWN STRL REUS W/TWL LRG LVL3 (GOWN DISPOSABLE) ×4
KIT TURNOVER KIT A (KITS) ×3 IMPLANT
LABEL OR SOLS (LABEL) ×3 IMPLANT
NDL SAFETY ECLIPSE 18X1.5 (NEEDLE) ×1 IMPLANT
NEEDLE FILTER BLUNT 18X 1/2SAF (NEEDLE) ×2
NEEDLE FILTER BLUNT 18X1 1/2 (NEEDLE) ×1 IMPLANT
NEEDLE HYPO 18GX1.5 SHARP (NEEDLE) ×2
NEEDLE HYPO 25X1 1.5 SAFETY (NEEDLE) ×9 IMPLANT
NS IRRIG 500ML POUR BTL (IV SOLUTION) ×3 IMPLANT
PACK EXTREMITY ARMC (MISCELLANEOUS) ×3 IMPLANT
PAD CAST CTTN 3X4 STRL (SOFTGOODS) ×1 IMPLANT
PAD PREP 24X41 OB/GYN DISP (PERSONAL CARE ITEMS) ×3 IMPLANT
PENCIL ELECTRO HAND CTR (MISCELLANEOUS) ×3 IMPLANT
RASP SM TEAR CROSS CUT (RASP) ×3 IMPLANT
SPLINT FAST PLASTER 5X30 (CAST SUPPLIES) ×2
SPLINT PLASTER CAST FAST 5X30 (CAST SUPPLIES) ×1 IMPLANT
STOCKINETTE M/LG 89821 (MISCELLANEOUS) ×3 IMPLANT
STOCKINETTE STRL 6IN 960660 (GAUZE/BANDAGES/DRESSINGS) ×3 IMPLANT
STRIP CLOSURE SKIN 1/4X4 (GAUZE/BANDAGES/DRESSINGS) ×2 IMPLANT
SUT ETHILON 5-0 FS-2 18 BLK (SUTURE) ×3 IMPLANT
SUT VIC AB 4-0 FS2 27 (SUTURE) ×3 IMPLANT
SUT VICRYL AB 3-0 FS1 BRD 27IN (SUTURE) ×3 IMPLANT
SYR 10ML LL (SYRINGE) ×6 IMPLANT
SYR 20CC LL (SYRINGE) ×3 IMPLANT
SYR 3ML LL SCALE MARK (SYRINGE) ×3 IMPLANT
WIRE Z .045 C-WIRE SPADE TIP (WIRE) ×6 IMPLANT
WIRE Z .062 C-WIRE SPADE TIP (WIRE) ×6 IMPLANT

## 2018-09-28 NOTE — Progress Notes (Signed)
Dr Elvina Mattes saw patient in preop and foot appeared red and hot to touch. MD drained wound and sent samples to lab. MD splinted foot and patients surgery was cancelled and will be rescheduled at MD discretion. Patient discharged home

## 2018-09-28 NOTE — H&P (Signed)
H and P has been reviewed.  Examination of the foot however shows there is increased redness and swelling to the left forefoot.  She had some swelling yesterday and a put her on colchicine as well as to antibiotics Cipro and clindamycin but her swelling and redness was worse today.  Uric acid run this morning at hospital shows 7.5 elevation of uric acid.  White count was 12.2 yesterday 11.5 today that has come down some but as I drained the bursal area on her first metatarsal head I removed not only fluid but also some chalky material appear to be gout crystals.  I sent this material for evaluation both culture and uric acid crystal evaluation and will base my treatment on that.  She is to continue wear splint and postop shoe and return hopefully next week once we get this under control.

## 2018-09-28 NOTE — Anesthesia Preprocedure Evaluation (Deleted)
Anesthesia Evaluation  Patient identified by MRN, date of birth, ID band Patient awake    Reviewed: Allergy & Precautions, H&P , NPO status , Patient's Chart, lab work & pertinent test results, reviewed documented beta blocker date and time   Airway Mallampati: II  TM Distance: >3 FB Neck ROM: full    Dental  (+) Teeth Intact   Pulmonary asthma ,    Pulmonary exam normal        Cardiovascular Exercise Tolerance: Poor hypertension, On Medications negative cardio ROS Normal cardiovascular exam Rate:Normal     Neuro/Psych negative neurological ROS  negative psych ROS   GI/Hepatic negative GI ROS, Neg liver ROS,   Endo/Other  negative endocrine ROS  Renal/GU negative Renal ROS  negative genitourinary   Musculoskeletal   Abdominal   Peds  Hematology negative hematology ROS (+)   Anesthesia Other Findings   Reproductive/Obstetrics negative OB ROS                             Anesthesia Physical Anesthesia Plan  ASA: II and emergent  Anesthesia Plan: General LMA   Post-op Pain Management:    Induction:   PONV Risk Score and Plan:   Airway Management Planned:   Additional Equipment:   Intra-op Plan:   Post-operative Plan:   Informed Consent: I have reviewed the patients History and Physical, chart, labs and discussed the procedure including the risks, benefits and alternatives for the proposed anesthesia with the patient or authorized representative who has indicated his/her understanding and acceptance.       Plan Discussed with: CRNA  Anesthesia Plan Comments:         Anesthesia Quick Evaluation

## 2018-09-28 NOTE — Discharge Instructions (Signed)
Continue wear the splint and postop shoe.  Take antibiotics and the colchicine as prescribed until you hear from my office.

## 2018-09-29 LAB — URIC ACID, BODY FLUID: Uric Acid Body Fluid: 5.9 mg/dL

## 2018-10-01 LAB — BODY FLUID CULTURE: Culture: NO GROWTH

## 2018-10-04 ENCOUNTER — Other Ambulatory Visit: Payer: Self-pay | Admitting: Podiatry

## 2018-10-06 ENCOUNTER — Encounter: Payer: Self-pay | Admitting: Anesthesiology

## 2018-10-06 MED ORDER — CLINDAMYCIN PHOSPHATE 900 MG/50ML IV SOLN
900.0000 mg | INTRAVENOUS | Status: AC
  Administered 2018-10-07: 900 mg via INTRAVENOUS

## 2018-10-07 ENCOUNTER — Encounter
Admission: RE | Disposition: A | Discharge status: Home / Self Care | Payer: Self-pay | Source: Home / Self Care | Attending: Podiatry

## 2018-10-07 ENCOUNTER — Other Ambulatory Visit: Payer: Self-pay

## 2018-10-07 ENCOUNTER — Encounter: Payer: Self-pay | Admitting: *Deleted

## 2018-10-07 ENCOUNTER — Ambulatory Visit
Admission: RE | Admit: 2018-10-07 | Discharge: 2018-10-07 | Disposition: A | Discharge status: Home / Self Care | Payer: Medicare Other | Attending: Podiatry | Admitting: Podiatry

## 2018-10-07 ENCOUNTER — Ambulatory Visit: Payer: Medicare Other | Admitting: Anesthesiology

## 2018-10-07 DIAGNOSIS — S93125A Dislocation of metatarsophalangeal joint of left lesser toe(s), initial encounter: Secondary | ICD-10-CM | POA: Diagnosis not present

## 2018-10-07 DIAGNOSIS — E785 Hyperlipidemia, unspecified: Secondary | ICD-10-CM | POA: Diagnosis not present

## 2018-10-07 DIAGNOSIS — Z8261 Family history of arthritis: Secondary | ICD-10-CM | POA: Diagnosis not present

## 2018-10-07 DIAGNOSIS — Z7951 Long term (current) use of inhaled steroids: Secondary | ICD-10-CM | POA: Diagnosis not present

## 2018-10-07 DIAGNOSIS — X58XXXA Exposure to other specified factors, initial encounter: Secondary | ICD-10-CM | POA: Insufficient documentation

## 2018-10-07 DIAGNOSIS — M5136 Other intervertebral disc degeneration, lumbar region: Secondary | ICD-10-CM | POA: Insufficient documentation

## 2018-10-07 DIAGNOSIS — M1A9XX1 Chronic gout, unspecified, with tophus (tophi): Secondary | ICD-10-CM | POA: Insufficient documentation

## 2018-10-07 DIAGNOSIS — J45909 Unspecified asthma, uncomplicated: Secondary | ICD-10-CM | POA: Insufficient documentation

## 2018-10-07 DIAGNOSIS — Z832 Family history of diseases of the blood and blood-forming organs and certain disorders involving the immune mechanism: Secondary | ICD-10-CM | POA: Insufficient documentation

## 2018-10-07 DIAGNOSIS — Z807 Family history of other malignant neoplasms of lymphoid, hematopoietic and related tissues: Secondary | ICD-10-CM | POA: Diagnosis not present

## 2018-10-07 DIAGNOSIS — Z882 Allergy status to sulfonamides status: Secondary | ICD-10-CM | POA: Insufficient documentation

## 2018-10-07 DIAGNOSIS — M1990 Unspecified osteoarthritis, unspecified site: Secondary | ICD-10-CM | POA: Insufficient documentation

## 2018-10-07 DIAGNOSIS — I10 Essential (primary) hypertension: Secondary | ICD-10-CM | POA: Insufficient documentation

## 2018-10-07 DIAGNOSIS — M2042 Other hammer toe(s) (acquired), left foot: Secondary | ICD-10-CM | POA: Insufficient documentation

## 2018-10-07 DIAGNOSIS — Z880 Allergy status to penicillin: Secondary | ICD-10-CM | POA: Diagnosis not present

## 2018-10-07 DIAGNOSIS — Z881 Allergy status to other antibiotic agents status: Secondary | ICD-10-CM | POA: Insufficient documentation

## 2018-10-07 DIAGNOSIS — M2012 Hallux valgus (acquired), left foot: Secondary | ICD-10-CM | POA: Insufficient documentation

## 2018-10-07 DIAGNOSIS — Z79899 Other long term (current) drug therapy: Secondary | ICD-10-CM | POA: Insufficient documentation

## 2018-10-07 DIAGNOSIS — S93129A Dislocation of metatarsophalangeal joint of unspecified toe(s), initial encounter: Secondary | ICD-10-CM | POA: Diagnosis present

## 2018-10-07 HISTORY — PX: HAMMER TOE SURGERY: SHX385

## 2018-10-07 HISTORY — PX: WEIL OSTEOTOMY: SHX5044

## 2018-10-07 HISTORY — PX: ORIF TOE FRACTURE: SHX5032

## 2018-10-07 HISTORY — PX: OSTECTOMY: SHX6439

## 2018-10-07 SURGERY — OSTECTOMY
Anesthesia: General | Laterality: Left

## 2018-10-07 MED ORDER — ONDANSETRON HCL 4 MG/2ML IJ SOLN: 4.0000 mg | Freq: Once | INTRAMUSCULAR | Status: DC | PRN

## 2018-10-07 MED ORDER — CLINDAMYCIN PHOSPHATE 900 MG/50ML IV SOLN
INTRAVENOUS | Status: AC
  Filled 2018-10-07: qty 50

## 2018-10-07 MED ORDER — BUPIVACAINE LIPOSOME 1.3 % IJ SUSP
INTRAMUSCULAR | Status: AC
  Filled 2018-10-07: qty 20

## 2018-10-07 MED ORDER — PHENYLEPHRINE HCL 10 MG/ML IJ SOLN
INTRAMUSCULAR | Status: DC | PRN
  Administered 2018-10-07: 100 ug via INTRAVENOUS
  Administered 2018-10-07: 200 ug via INTRAVENOUS
  Administered 2018-10-07 (×2): 100 ug via INTRAVENOUS

## 2018-10-07 MED ORDER — ONDANSETRON HCL 4 MG/2ML IJ SOLN
INTRAMUSCULAR | Status: DC | PRN
  Administered 2018-10-07: 4 mg via INTRAVENOUS

## 2018-10-07 MED ORDER — PHENYLEPHRINE HCL 10 MG/ML IJ SOLN
INTRAMUSCULAR | Status: AC
  Filled 2018-10-07: qty 1

## 2018-10-07 MED ORDER — ONDANSETRON HCL 4 MG/2ML IJ SOLN
INTRAMUSCULAR | Status: AC
  Filled 2018-10-07: qty 2

## 2018-10-07 MED ORDER — FENTANYL CITRATE (PF) 100 MCG/2ML IJ SOLN
INTRAMUSCULAR | Status: AC
  Filled 2018-10-07: qty 2

## 2018-10-07 MED ORDER — ACETAMINOPHEN 10 MG/ML IV SOLN
INTRAVENOUS | Status: DC | PRN
  Administered 2018-10-07: 1000 mg via INTRAVENOUS

## 2018-10-07 MED ORDER — SODIUM CHLORIDE 0.9 % IV SOLN
INTRAVENOUS | Status: DC | PRN
  Administered 2018-10-07: 35 ug/min via INTRAVENOUS

## 2018-10-07 MED ORDER — PROPOFOL 10 MG/ML IV BOLUS
INTRAVENOUS | Status: DC | PRN
  Administered 2018-10-07: 140 mg via INTRAVENOUS

## 2018-10-07 MED ORDER — BUPIVACAINE HCL 0.5 % IJ SOLN
INTRAMUSCULAR | Status: DC | PRN
  Administered 2018-10-07: 7.5 mL

## 2018-10-07 MED ORDER — LIDOCAINE HCL (PF) 2 % IJ SOLN
INTRAMUSCULAR | Status: AC
  Filled 2018-10-07: qty 10

## 2018-10-07 MED ORDER — OXYCODONE-ACETAMINOPHEN 7.5-325 MG PO TABS: 1.0000 | ORAL_TABLET | ORAL | 0 refills | Status: AC | PRN

## 2018-10-07 MED ORDER — FAMOTIDINE 20 MG PO TABS
ORAL_TABLET | ORAL | Status: AC
  Administered 2018-10-07: 07:00:00 20 mg via ORAL
  Filled 2018-10-07: qty 1

## 2018-10-07 MED ORDER — FENTANYL CITRATE (PF) 100 MCG/2ML IJ SOLN: 25.0000 ug | INTRAMUSCULAR | Status: DC | PRN

## 2018-10-07 MED ORDER — LACTATED RINGERS IV SOLN
INTRAVENOUS | Status: DC
  Administered 2018-10-07 (×2): via INTRAVENOUS

## 2018-10-07 MED ORDER — BUPIVACAINE HCL (PF) 0.5 % IJ SOLN
INTRAMUSCULAR | Status: AC
  Filled 2018-10-07: qty 30

## 2018-10-07 MED ORDER — DEXAMETHASONE SODIUM PHOSPHATE 10 MG/ML IJ SOLN
INTRAMUSCULAR | Status: DC | PRN
  Administered 2018-10-07: 5 mg via INTRAVENOUS

## 2018-10-07 MED ORDER — FAMOTIDINE 20 MG PO TABS
20.0000 mg | ORAL_TABLET | Freq: Once | ORAL | Status: AC
  Administered 2018-10-07: 07:00:00 20 mg via ORAL

## 2018-10-07 MED ORDER — LIDOCAINE HCL (CARDIAC) PF 100 MG/5ML IV SOSY
PREFILLED_SYRINGE | INTRAVENOUS | Status: DC | PRN
  Administered 2018-10-07: 100 mg via INTRAVENOUS

## 2018-10-07 MED ORDER — FENTANYL CITRATE (PF) 100 MCG/2ML IJ SOLN
INTRAMUSCULAR | Status: DC | PRN
  Administered 2018-10-07 (×2): 25 ug via INTRAVENOUS
  Administered 2018-10-07: 50 ug via INTRAVENOUS

## 2018-10-07 MED ORDER — PROPOFOL 10 MG/ML IV BOLUS
INTRAVENOUS | Status: AC
  Filled 2018-10-07: qty 40

## 2018-10-07 MED ORDER — GLYCOPYRROLATE 0.2 MG/ML IJ SOLN
INTRAMUSCULAR | Status: AC
  Filled 2018-10-07: qty 1

## 2018-10-07 MED ORDER — GLYCOPYRROLATE 0.2 MG/ML IJ SOLN
INTRAMUSCULAR | Status: DC | PRN
  Administered 2018-10-07: 0.2 mg via INTRAVENOUS

## 2018-10-07 MED ORDER — DEXAMETHASONE SODIUM PHOSPHATE 10 MG/ML IJ SOLN
INTRAMUSCULAR | Status: AC
  Filled 2018-10-07: qty 1

## 2018-10-07 MED ORDER — POVIDONE-IODINE 7.5 % EX SOLN
Freq: Once | CUTANEOUS | Status: DC
  Filled 2018-10-07: qty 118

## 2018-10-07 MED ORDER — LIDOCAINE HCL 1 % IJ SOLN
INTRAMUSCULAR | Status: DC | PRN
  Administered 2018-10-07: 7.5 mL

## 2018-10-07 MED ORDER — SUCCINYLCHOLINE CHLORIDE 20 MG/ML IJ SOLN
INTRAMUSCULAR | Status: AC
  Filled 2018-10-07: qty 1

## 2018-10-07 MED ORDER — LIDOCAINE HCL (PF) 1 % IJ SOLN
INTRAMUSCULAR | Status: AC
  Filled 2018-10-07: qty 30

## 2018-10-07 SURGICAL SUPPLY — 77 items
0.62 cwire ×6 IMPLANT
0.9mm wire ×6 IMPLANT
2.o Headless countersink ×3 IMPLANT
BAG COUNTER SPONGE EZ (MISCELLANEOUS) IMPLANT
BANDAGE ELASTIC 4 LF NS (GAUZE/BANDAGES/DRESSINGS) ×6 IMPLANT
BANDAGE ELASTIC 6 LF NS (GAUZE/BANDAGES/DRESSINGS) ×3 IMPLANT
BENZOIN TINCTURE PRP APPL 2/3 (GAUZE/BANDAGES/DRESSINGS) ×3 IMPLANT
BIT DRILL 1.7 LNG CANN (DRILL) ×3 IMPLANT
BIT DRILL SOLID 2.0 X 110MM (DRILL) ×1 IMPLANT
BLADE MED AGGRESSIVE (BLADE) ×3 IMPLANT
BLADE OSC/SAGITTAL 5.5X25 (BLADE) IMPLANT
BLADE OSC/SAGITTAL MD 5.5X18 (BLADE) ×3 IMPLANT
BLADE OSC/SAGITTAL MD 9X18.5 (BLADE) IMPLANT
BLADE SURG 15 STRL LF DISP TIS (BLADE) ×2 IMPLANT
BLADE SURG 15 STRL SS (BLADE) ×4
BLADE SURG MINI STRL (BLADE) ×3 IMPLANT
BNDG CONFORM 2 STRL LF (GAUZE/BANDAGES/DRESSINGS) ×3 IMPLANT
BNDG CONFORM 3 STRL LF (GAUZE/BANDAGES/DRESSINGS) ×6 IMPLANT
BNDG ESMARK 4X12 TAN STRL LF (GAUZE/BANDAGES/DRESSINGS) ×3 IMPLANT
BNDG GAUZE 4.5X4.1 6PLY STRL (MISCELLANEOUS) ×3 IMPLANT
CANISTER SUCT 1200ML W/VALVE (MISCELLANEOUS) ×3 IMPLANT
CAST PADDING 3X4FT ST 30246 (SOFTGOODS) ×2
CLOSURE WOUND 1/4X4 (GAUZE/BANDAGES/DRESSINGS) ×1
CNTRSNK DRL 2 HDLS SCR (MISCELLANEOUS) ×1 IMPLANT
COUNTER SPONGE BAG EZ (MISCELLANEOUS)
COUNTERSINK 2.0 (MISCELLANEOUS) ×2
COVER PIN YLW 0.028-062 (MISCELLANEOUS) ×3 IMPLANT
COVER WAND RF STERILE (DRAPES) IMPLANT
CUFF TOURN 18 STER (MISCELLANEOUS) ×3 IMPLANT
CUFF TOURN DUAL PL 12 NO SLV (MISCELLANEOUS) ×3 IMPLANT
DRAPE FLUOR MINI C-ARM 54X84 (DRAPES) ×3 IMPLANT
DRILL SOLID 2.0 X 110MM (DRILL) ×3
DRSG TEGADERM 4X4.75 (GAUZE/BANDAGES/DRESSINGS) ×3 IMPLANT
DURAPREP 26ML APPLICATOR (WOUND CARE) ×3 IMPLANT
ELECT REM PT RETURN 9FT ADLT (ELECTROSURGICAL) ×3
ELECTRODE REM PT RTRN 9FT ADLT (ELECTROSURGICAL) ×1 IMPLANT
GAUZE PETRO XEROFOAM 1X8 (MISCELLANEOUS) IMPLANT
GAUZE SPONGE 4X4 12PLY STRL (GAUZE/BANDAGES/DRESSINGS) ×3 IMPLANT
GLOVE BIO SURGEON STRL SZ8 (GLOVE) ×3 IMPLANT
GLOVE INDICATOR 8.0 STRL GRN (GLOVE) ×3 IMPLANT
GOWN STRL REUS W/ TWL LRG LVL3 (GOWN DISPOSABLE) ×2 IMPLANT
GOWN STRL REUS W/TWL LRG LVL3 (GOWN DISPOSABLE) ×4
KIT PROCEDURE DRILL (DRILL) ×3 IMPLANT
KIT TURNOVER KIT A (KITS) ×3 IMPLANT
LABEL OR SOLS (LABEL) ×3 IMPLANT
NDL SAFETY ECLIPSE 18X1.5 (NEEDLE) ×1 IMPLANT
NEEDLE FILTER BLUNT 18X 1/2SAF (NEEDLE) ×2
NEEDLE FILTER BLUNT 18X1 1/2 (NEEDLE) ×1 IMPLANT
NEEDLE HYPO 18GX1.5 SHARP (NEEDLE) ×2
NEEDLE HYPO 25X1 1.5 SAFETY (NEEDLE) ×9 IMPLANT
NS IRRIG 500ML POUR BTL (IV SOLUTION) ×3 IMPLANT
PACK EXTREMITY ARMC (MISCELLANEOUS) ×3 IMPLANT
PAD CAST CTTN 3X4 STRL (SOFTGOODS) ×1 IMPLANT
PAD PREP 24X41 OB/GYN DISP (PERSONAL CARE ITEMS) ×3 IMPLANT
PASSER SUT SWANSON 36MM LOOP (INSTRUMENTS) ×3 IMPLANT
PENCIL ELECTRO HAND CTR (MISCELLANEOUS) ×3 IMPLANT
RASP SM TEAR CROSS CUT (RASP) ×3 IMPLANT
SCREW HEADED 2.5X32 (Screw) ×3 IMPLANT
SCREW HEADLESS 2.0X11MM (Screw) ×6 IMPLANT
SCREW HEADLESS SHRT THRD 2X12 (Screw) ×6 IMPLANT
SPLINT FAST PLASTER 5X30 (CAST SUPPLIES) ×2
SPLINT PLASTER CAST FAST 5X30 (CAST SUPPLIES) ×1 IMPLANT
STAPLE DYNACLIP 8X8 (Staple) ×3 IMPLANT
STOCKINETTE M/LG 89821 (MISCELLANEOUS) ×3 IMPLANT
STOCKINETTE STRL 6IN 960660 (GAUZE/BANDAGES/DRESSINGS) ×3 IMPLANT
STRIP CLOSURE SKIN 1/4X4 (GAUZE/BANDAGES/DRESSINGS) ×2 IMPLANT
SUT ETHILON 5-0 FS-2 18 BLK (SUTURE) ×3 IMPLANT
SUT ETHILON 5.0 S-24 (SUTURE) ×3 IMPLANT
SUT ORTHOCORD W/MULTIPK NDL (SUTURE) ×3 IMPLANT
SUT VIC AB 4-0 FS2 27 (SUTURE) ×9 IMPLANT
SUT VICRYL AB 3-0 FS1 BRD 27IN (SUTURE) ×3 IMPLANT
SYR 10ML LL (SYRINGE) ×6 IMPLANT
SYR 20CC LL (SYRINGE) ×3 IMPLANT
SYR 3ML LL SCALE MARK (SYRINGE) ×3 IMPLANT
WIRE SMOOTH TROCAR .9MMX150MML (WIRE) ×9 IMPLANT
WIRE Z .045 C-WIRE SPADE TIP (WIRE) ×9 IMPLANT
WIRE Z .062 C-WIRE SPADE TIP (WIRE) ×12 IMPLANT
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
## 2018-10-07 NOTE — Anesthesia Procedure Notes (Signed)
Procedure Name: LMA Insertion Performed by: Averi Kilty, CRNA Pre-anesthesia Checklist: Patient identified, Patient being monitored, Timeout performed, Emergency Drugs available and Suction available Patient Re-evaluated:Patient Re-evaluated prior to induction Oxygen Delivery Method: Circle system utilized Preoxygenation: Pre-oxygenation with 100% oxygen Induction Type: IV induction Ventilation: Mask ventilation without difficulty LMA: LMA inserted LMA Size: 3.5 Tube type: Oral Number of attempts: 1 Placement Confirmation: positive ETCO2 and breath sounds checked- equal and bilateral Tube secured with: Tape Dental Injury: Teeth and Oropharynx as per pre-operative assessment        

## 2018-10-07 NOTE — Transfer of Care (Signed)
Immediate Anesthesia Transfer of Care Note  Patient: Erika Finley  Procedure(s) Performed: DOUBLE OSTEOTOMY LEFT (Left ) WEIL OSTEOTOMY 2ND AND 3RD METATARSAL (Left ) HAMMER TOE CORRECTION (Left ) OPEN REDUCTION INTERNAL FIXATION (ORIF) OF DISLOCATED 2ND METATARSAL (TOE) (Left )  Patient Location: PACU  Anesthesia Type:General  Level of Consciousness: awake, alert  and oriented  Airway & Oxygen Therapy: Patient Spontanous Breathing and Patient connected to face mask oxygen  Post-op Assessment: Report given to RN and Post -op Vital signs reviewed and stable  Post vital signs: Reviewed and stable  Last Vitals:  Vitals Value Taken Time  BP 134/75 10/07/2018 10:54 AM  Temp    Pulse 105 10/07/2018 10:57 AM  Resp 16 10/07/2018 10:57 AM  SpO2 92 % 10/07/2018 10:57 AM  Vitals shown include unvalidated device data.  Last Pain:  Vitals:   10/07/18 0620  TempSrc: Oral  PainSc: 0-No pain         Complications: No apparent anesthesia complications

## 2018-10-07 NOTE — Discharge Instructions (Signed)
AMBULATORY SURGERY  DISCHARGE INSTRUCTIONS   1) The drugs that you were given will stay in your system until tomorrow so for the next 24 hours you should not:  A) Drive an automobile B) Make any legal decisions C) Drink any alcoholic beverage   2) You may resume regular meals tomorrow.  Today it is better to start with liquids and gradually work up to solid foods.  You may eat anything you prefer, but it is better to start with liquids, then soup and crackers, and gradually work up to solid foods.   3) Please notify your doctor immediately if you have any unusual bleeding, trouble breathing, redness and pain at the surgery site, drainage, fever, or pain not relieved by medication.    4) Additional Instructions:        Please contact your physician with any problems or Same Day Surgery at [phone], Monday through Friday 6 am to 4 pm, or [facility] number at [phone].   [address] [place]   1. Take your medication as prescribed.  Pain medication should be taken only as needed.  2. Keep the dressing clean, dry and intact.  3. Keep your foot elevated above the heart level for the first 48 hours.  4. Walking to the bathroom and brief periods of walking are acceptable,Please use a walker when walking and take 50%of the weight off your foot.Always wear your post-op shoe when walking. .  5. Do not take a shower. Baths are permissible as long as the foot is kept out of the water.   6. Every hour you are awake:  - Bend your knee 15 times. - Flex foot 15 times - Massage calf 15 times  7. Call [facility] [phone]) if any of the following problems occur: - You develop a temperature or fever. - The bandage becomes saturated with blood. - Medication does not stop your pain. - Injury of the foot occurs. - Any  symptoms of infection including redness, odor, or red streaks running from wound. -

## 2018-10-07 NOTE — Anesthesia Postprocedure Evaluation (Signed)
Anesthesia Post Note  Patient: Erika Finley  Procedure(s) Performed: DOUBLE OSTEOTOMY LEFT (Left ) WEIL OSTEOTOMY 2ND AND 3RD METATARSAL (Left ) HAMMER TOE CORRECTION (Left ) OPEN REDUCTION INTERNAL FIXATION (ORIF) OF DISLOCATED 2ND METATARSAL (TOE) (Left )  Patient location during evaluation: PACU Anesthesia Type: General Level of consciousness: awake and alert Pain management: pain level controlled Vital Signs Assessment: post-procedure vital signs reviewed and stable Respiratory status: spontaneous breathing, nonlabored ventilation, respiratory function stable and patient connected to nasal cannula oxygen Cardiovascular status: blood pressure returned to baseline and stable Postop Assessment: no apparent nausea or vomiting Anesthetic complications: no     Last Vitals:  Vitals:   10/07/18 1151 10/07/18 1213  BP: 105/62 (!) 116/48  Pulse: 81 88  Resp: 20 18  Temp: 36.8 C   SpO2: 95% 97%    Last Pain:  Vitals:   10/07/18 1213  TempSrc:   PainSc: 0-No pain                 Sarayah Bacchi S

## 2018-10-07 NOTE — Anesthesia Post-op Follow-up Note (Signed)
Anesthesia QCDR form completed.        

## 2018-10-07 NOTE — Anesthesia Preprocedure Evaluation (Signed)
Anesthesia Evaluation  Patient identified by MRN, date of birth, ID band Patient awake    Reviewed: Allergy & Precautions, NPO status , Patient's Chart, lab work & pertinent test results, reviewed documented beta blocker date and time   Airway Mallampati: III  TM Distance: >3 FB     Dental  (+) Chipped   Pulmonary asthma ,           Cardiovascular hypertension, Pt. on medications      Neuro/Psych    GI/Hepatic   Endo/Other    Renal/GU      Musculoskeletal  (+) Arthritis ,   Abdominal   Peds  Hematology   Anesthesia Other Findings EKG shows PACs. Echo 55-65 1 yr ago.  Reproductive/Obstetrics                             Anesthesia Physical Anesthesia Plan  ASA: III  Anesthesia Plan: General   Post-op Pain Management:    Induction: Intravenous  PONV Risk Score and Plan:   Airway Management Planned: Oral ETT and LMA  Additional Equipment:   Intra-op Plan:   Post-operative Plan:   Informed Consent: I have reviewed the patients History and Physical, chart, labs and discussed the procedure including the risks, benefits and alternatives for the proposed anesthesia with the patient or authorized representative who has indicated his/her understanding and acceptance.       Plan Discussed with: CRNA  Anesthesia Plan Comments:         Anesthesia Quick Evaluation

## 2018-10-07 NOTE — OR Nursing (Signed)
Dr. Elvina Mattes in to see pt 1238 pm; discharge pending son's arrival.

## 2018-10-07 NOTE — H&P (Signed)
H and P has been reviewed and no changes are noted. Pt is stable with no swelling or redness to foot now.. Was likely gout attack last week, which is under good control now.No other changes.

## 2018-10-08 ENCOUNTER — Encounter: Payer: Self-pay | Admitting: Podiatry

## 2018-10-08 LAB — SURGICAL PATHOLOGY

## 2018-10-21 IMAGING — CR DG CHEST 2V
2 series · 2 of 2 positions shown · non-contrast
Comparison: None.

CLINICAL DATA: Pt reports she woke this AM with left sided chest
pain that felt "sharp." Pain radiates into jaw and throat. Pt is SOB
during xray. O2 sat 89%, RA. Pt has hx of asthma only. Pt denies
N/V. Never a smoker

EXAM:
CHEST  2 VIEW

[chest pa]
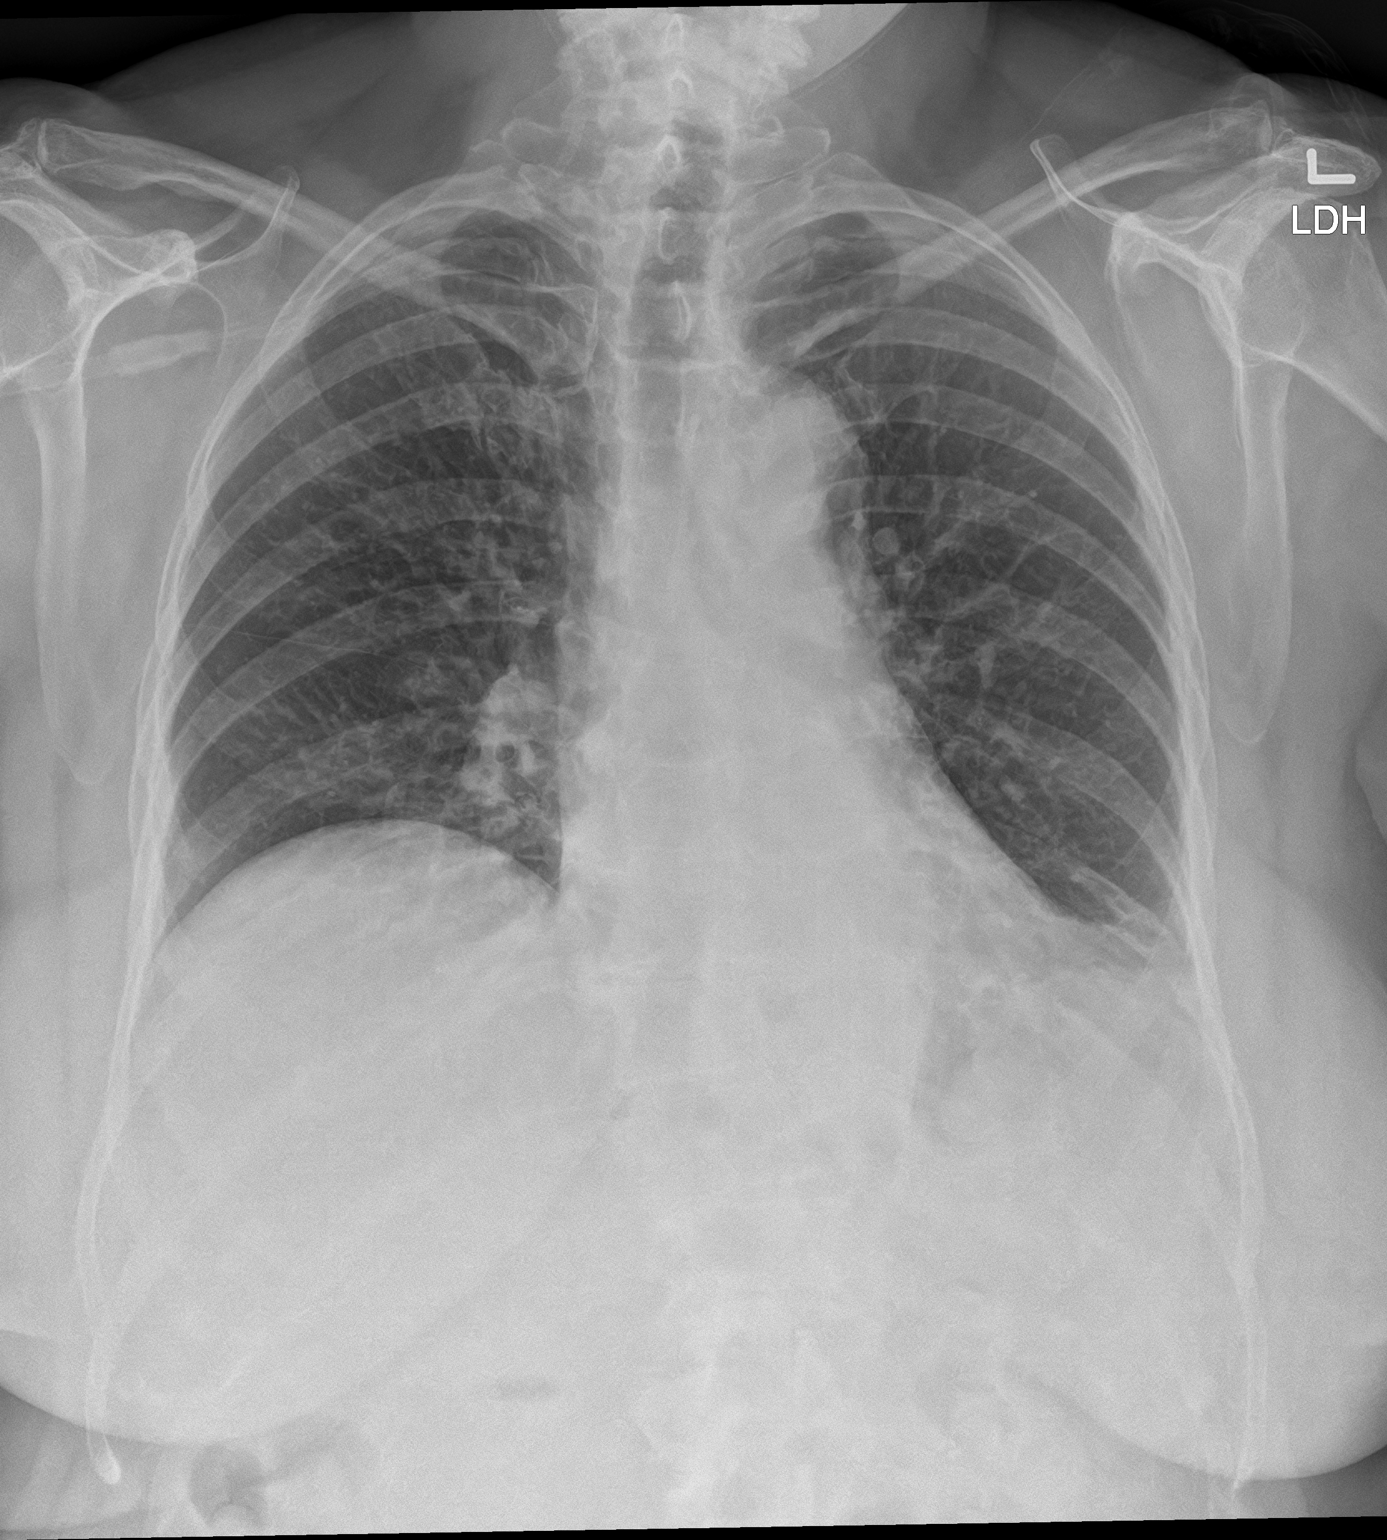

[chest lat]
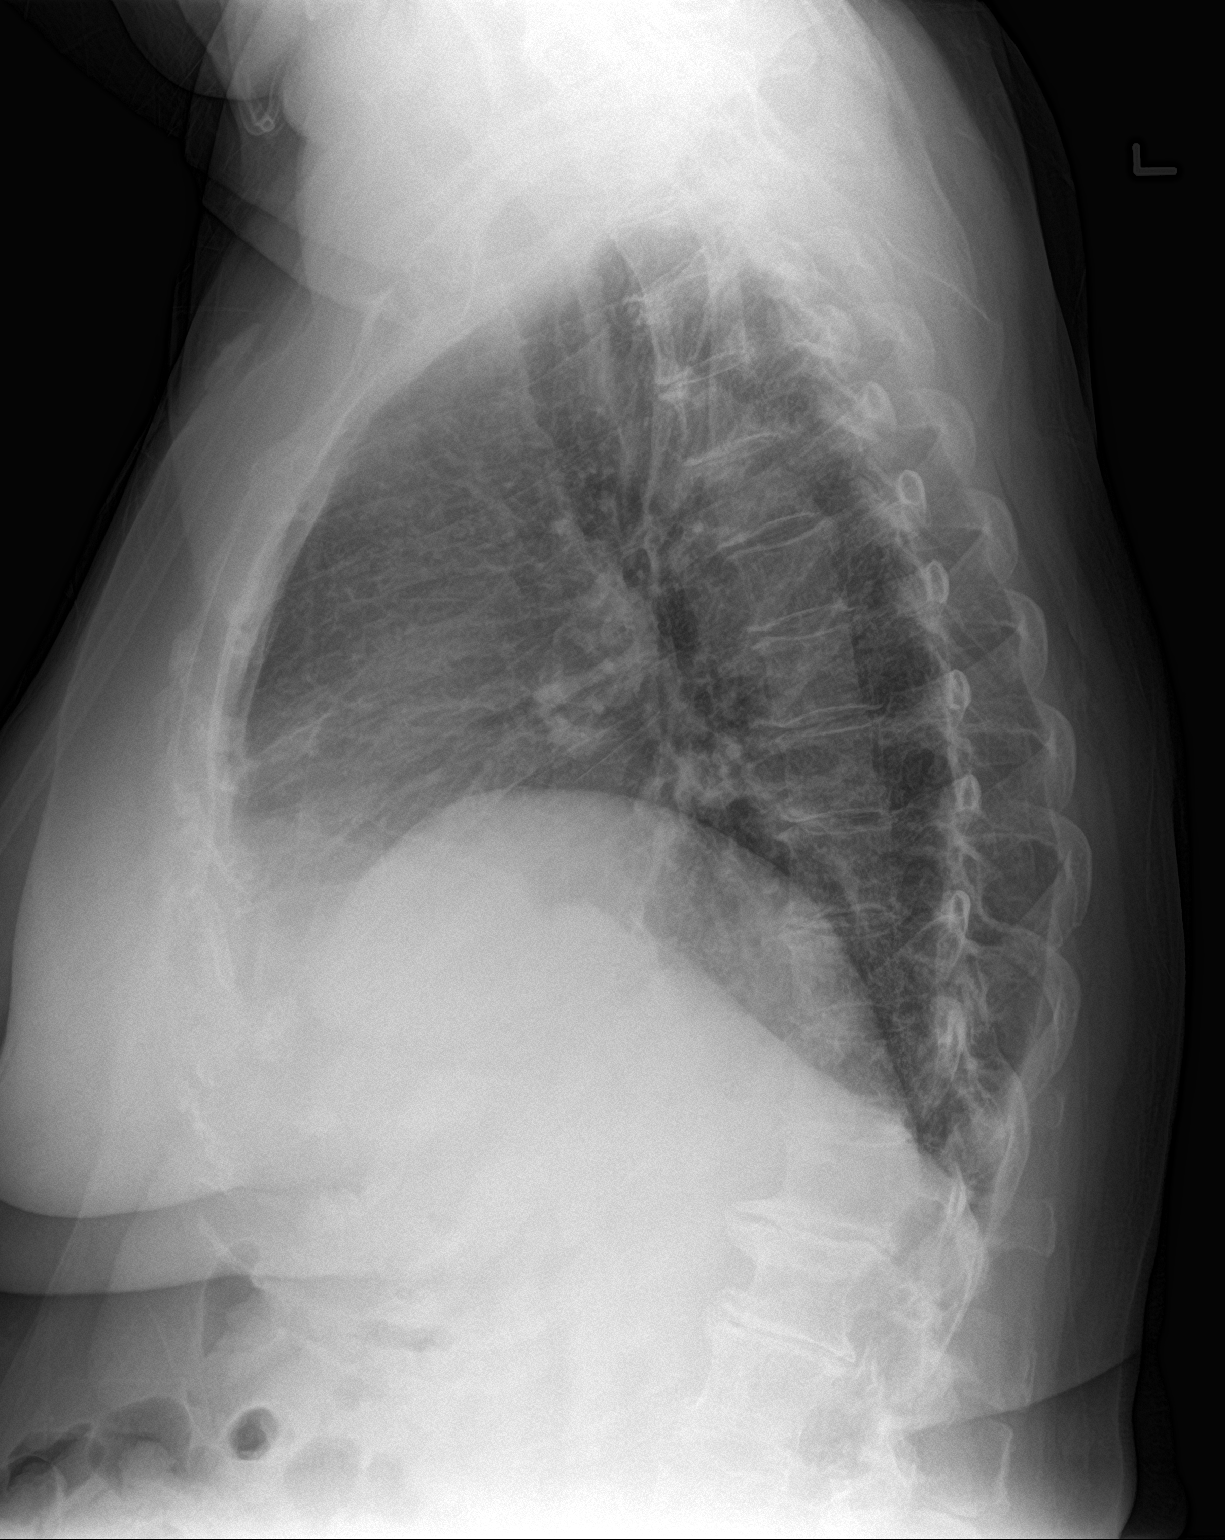

[2 of 2 positions shown; findings below may reference images not displayed]

FINDINGS: Moderate lower thoracic spondylosis. Midline trachea. Normal heart
size. Atherosclerosis in the transverse aorta. Moderate right
hemidiaphragm elevation. No pleural effusion or pneumothorax. Mild
left lower lobe or lingular airspace disease, most apparent on the
frontal radiograph.
IMPRESSION: Left lower local and/or lingular airspace disease. This could
represent atelectasis or infection. CT is pending.

Aortic Atherosclerosis ([id number]).

## 2018-11-13 IMAGING — DX DG CHEST 1V PORT
1 series · 1 of 1 positions shown · non-contrast
Comparison: 04/02/2017 CT and radiograph of the chest.

CLINICAL DATA: 81 y/o  F; audible wheeze.

EXAM:
PORTABLE CHEST 1 VIEW

[chest ap]
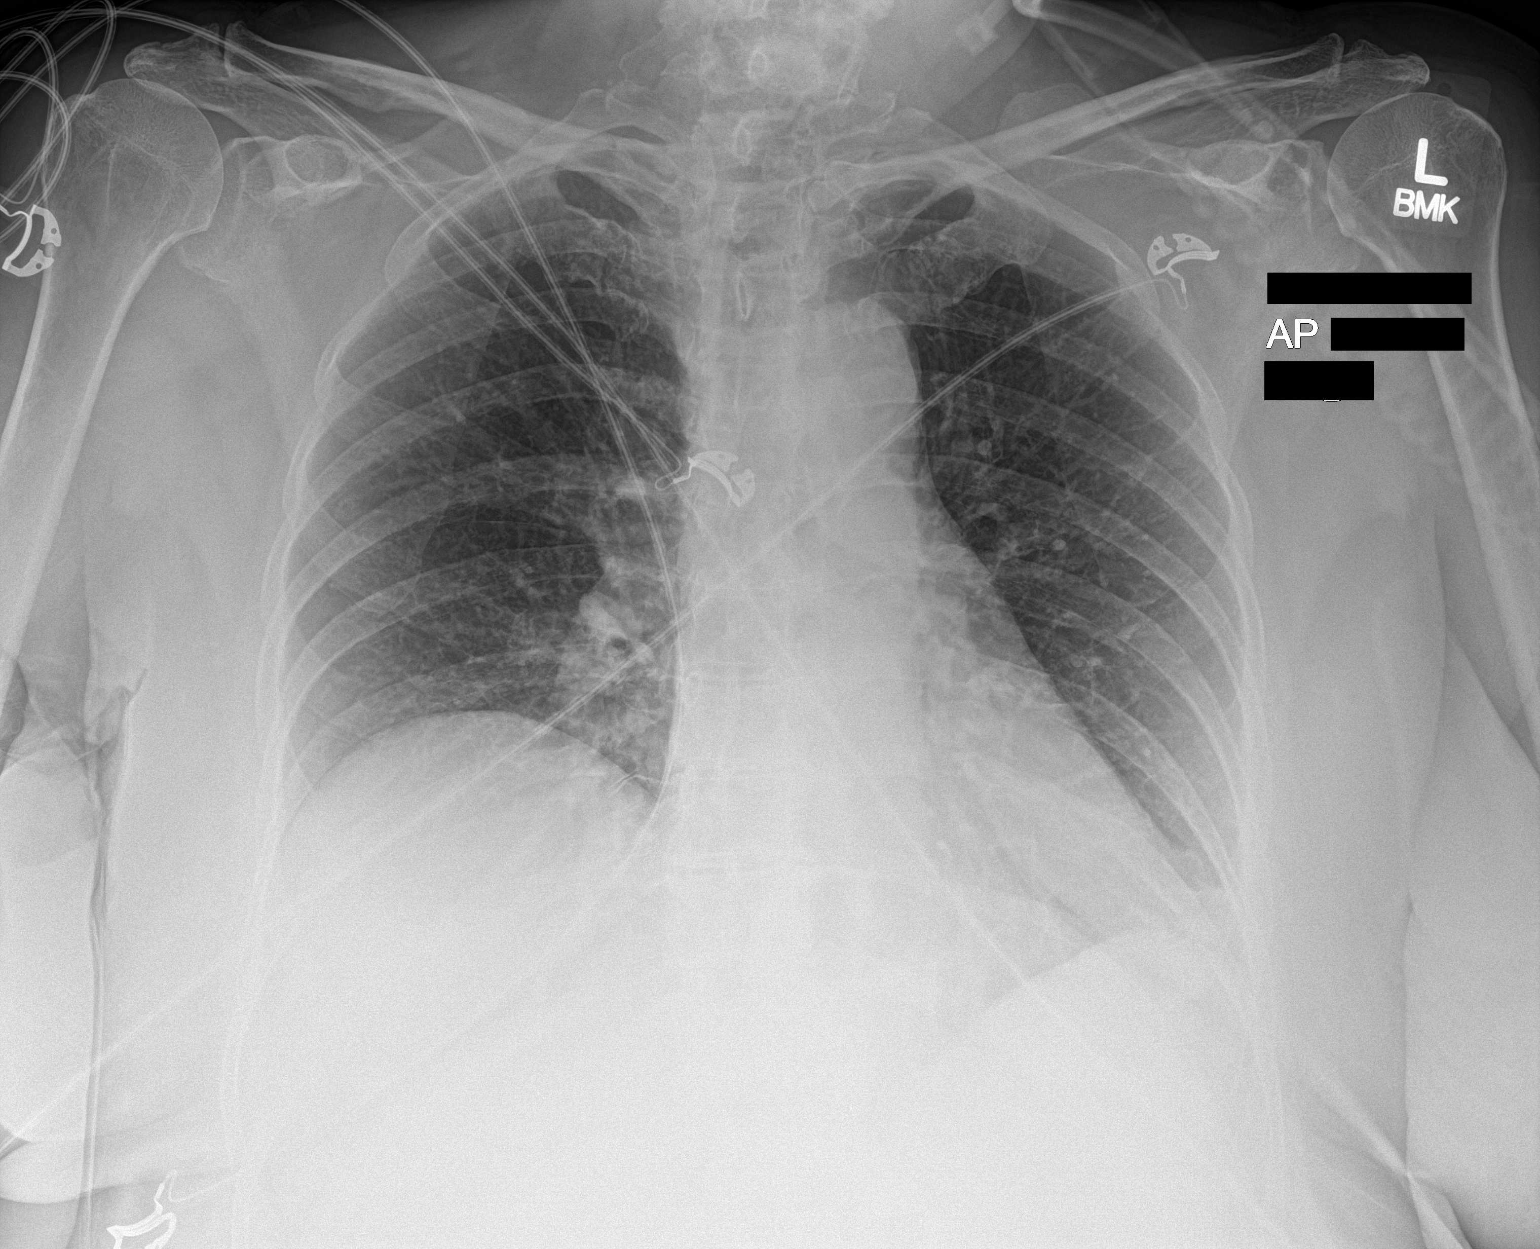

[1 of 1 positions shown; findings below may reference images not displayed]

FINDINGS: Stable heart size and mediastinal contours are within normal limits.
Both lungs are clear. The visualized skeletal structures are
unremarkable.
IMPRESSION: No acute pulmonary process identified.

By: Kaki Jim M.D.

## 2018-12-30 IMAGING — DX DG CHEST 1V PORT
1 series · 1 of 1 positions shown · non-contrast
Comparison: April 27, 2017

CLINICAL DATA: Shortness of breath and wheezing

EXAM:
PORTABLE CHEST 1 VIEW

[chest ap]
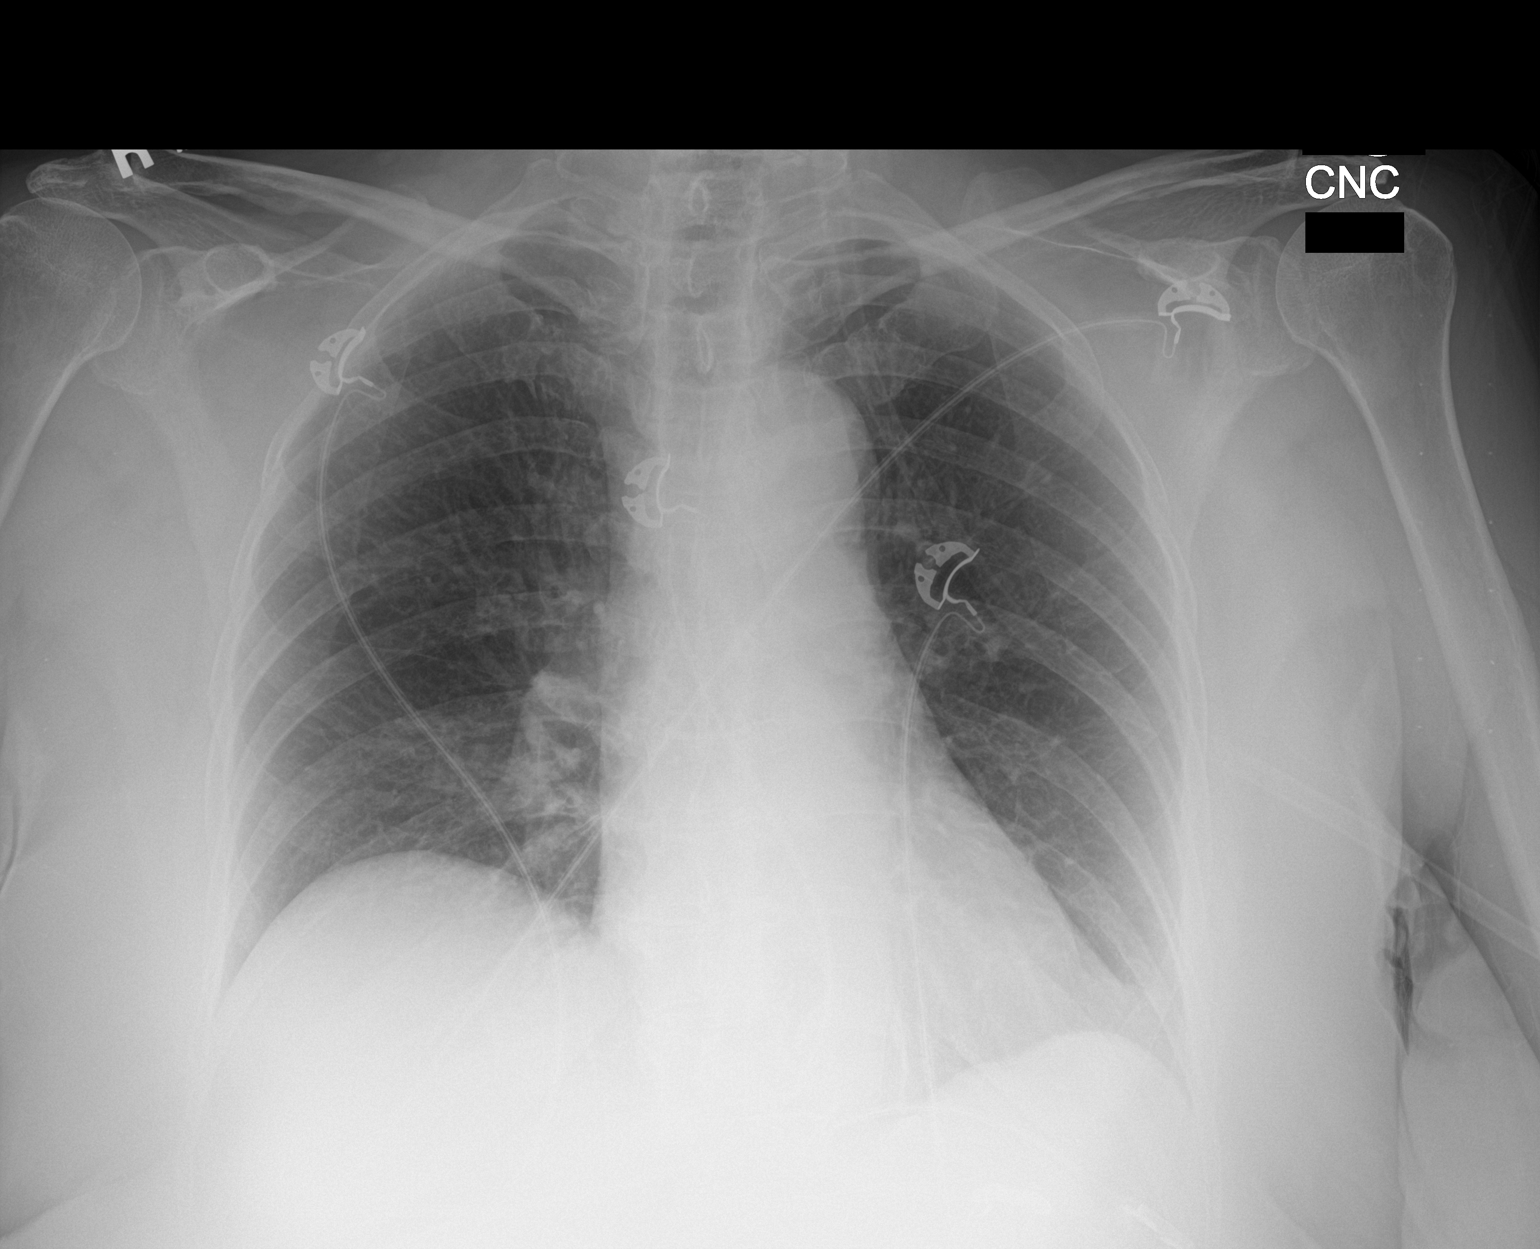

[1 of 1 positions shown; findings below may reference images not displayed]

FINDINGS: There is no edema or consolidation. Heart size and pulmonary
vascularity are normal. No adenopathy. No evident bone lesions.
IMPRESSION: No edema or consolidation.

## 2019-01-26 IMAGING — RF DG SWALLOWING FUNCTION
8 series · 13 of 24 positions shown · non-contrast
Comparison: None.

CLINICAL DATA: 81-year-old female. Asthma attack June 2017.
Initial encounter.

EXAM:
MODIFIED BARIUM SWALLOW
TECHNIQUE: Different consistencies of barium were administered orally to the
patient by the Speech Pathologist. Imaging of the pharynx was
performed in the lateral projection.
FLUOROSCOPY TIME:  Fluoroscopy Time:  42 seconds

[Series 1: run · 2 of 69 frames shown (1 of 8)]
[frame 11/69]
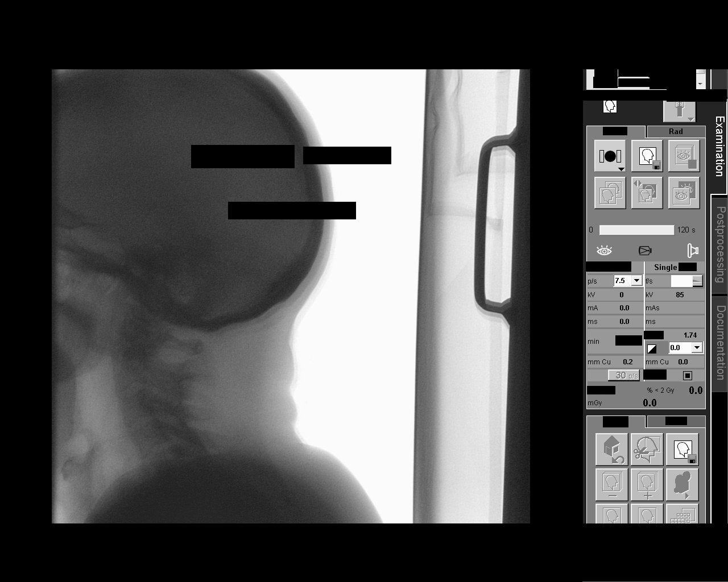
[frame 59/69]
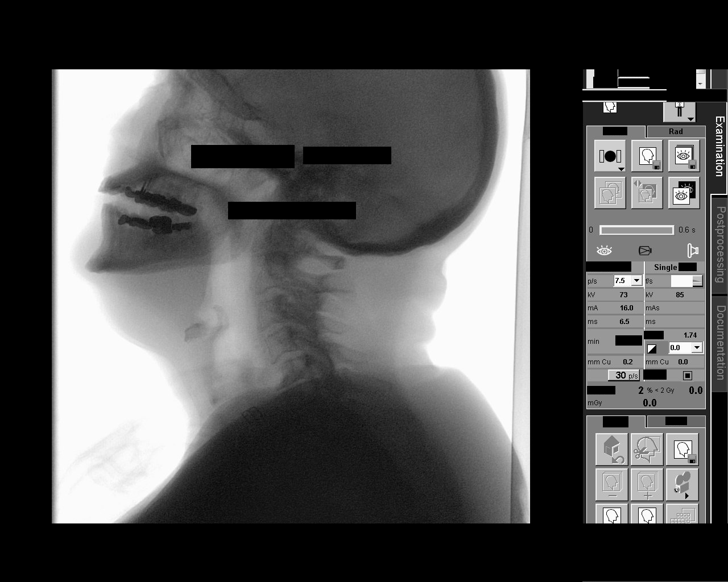

[Series 2: run · 1 of 149 frames shown (2 of 8)]
[frame 75/149]
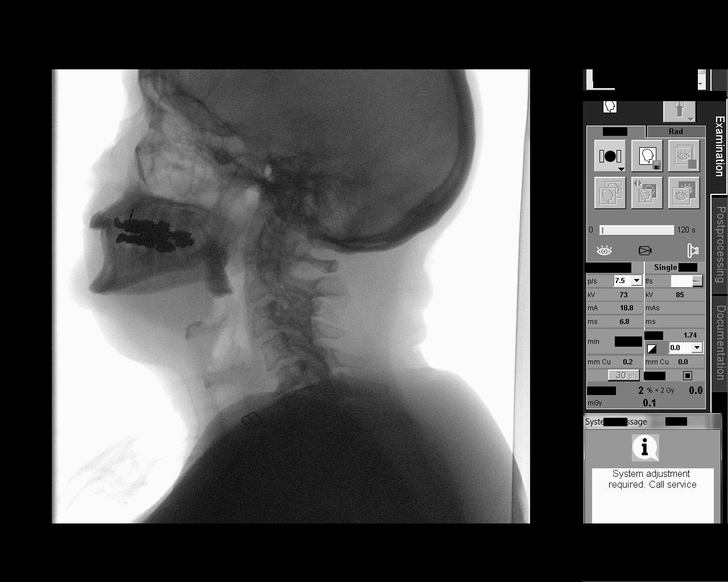

[Series 3: run · 2 of 127 frames shown (3 of 8)]
[frame 20/127]
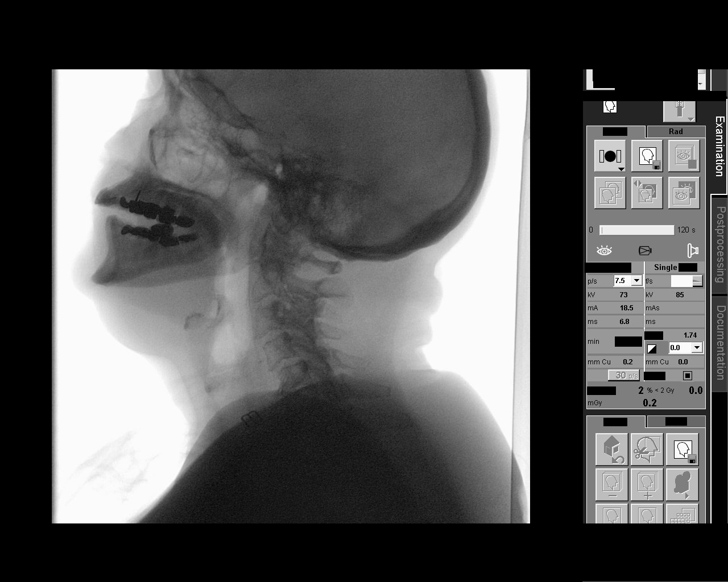
[frame 108/127]
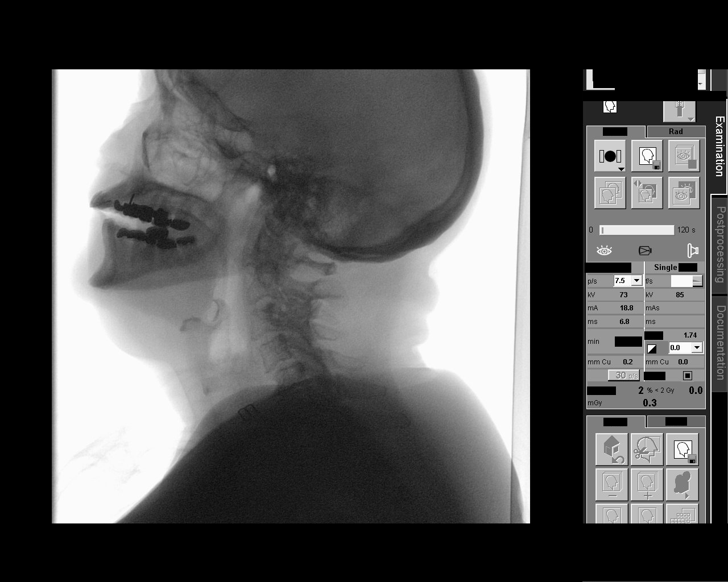

[Series 4: run · 1 of 91 frames shown (4 of 8)]
[frame 21/91]
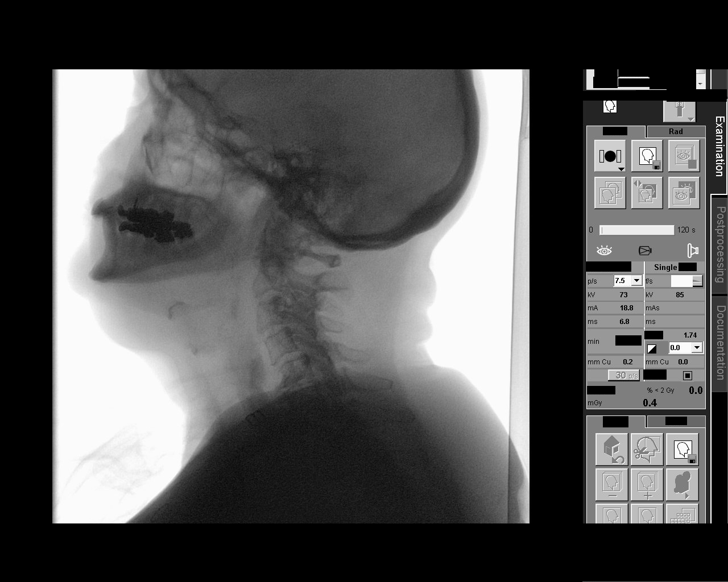

[Series 5: run · 2 of 30 frames shown (5 of 8)]
[frame 5/30]
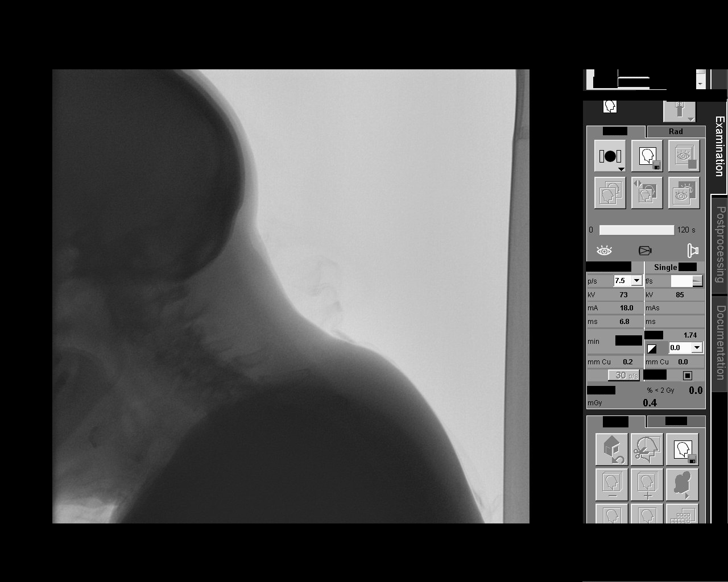
[frame 16/30]
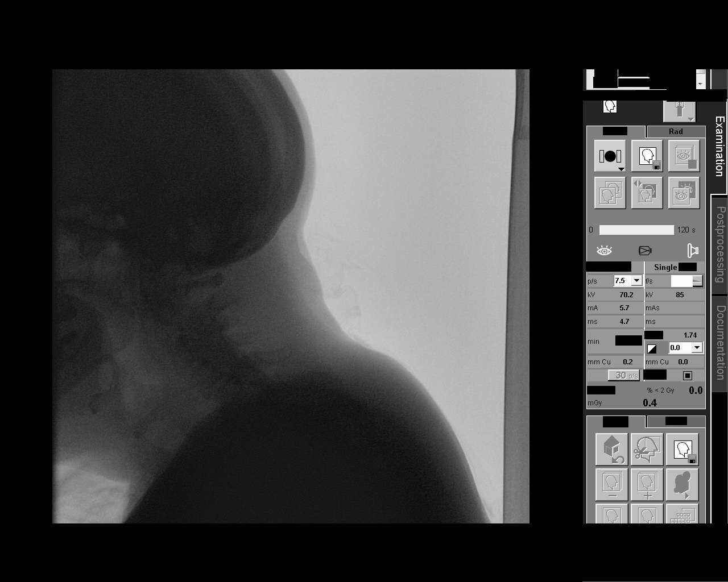

[Series 6: run · 2 of 228 frames shown (6 of 8)]
[frame 23/228]
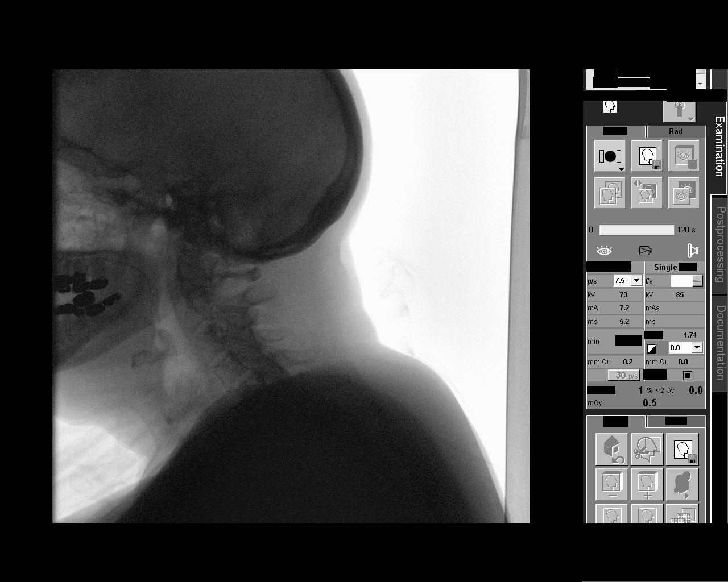
[frame 194/228]
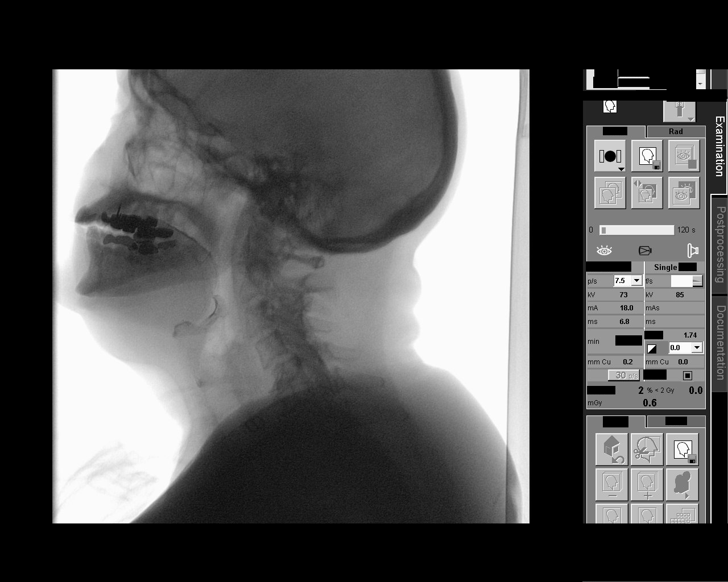

[Series 7: run · 1 of 163 frames shown (7 of 8)]
[frame 82/163]
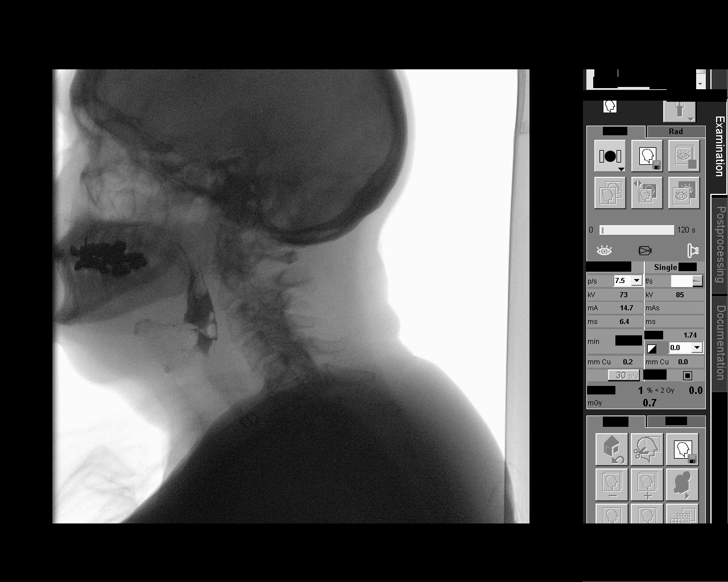

[Series 8: run · 2 of 494 frames shown (8 of 8)]
[frame 75/494]
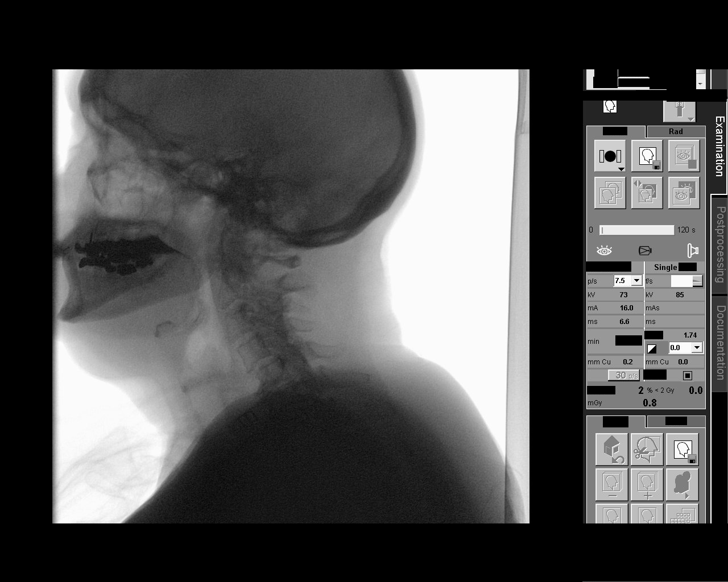
[frame 420/494]
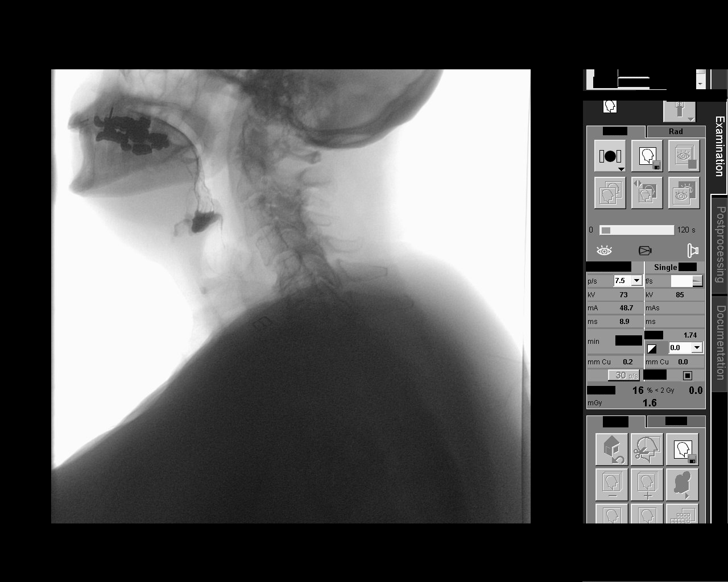

[13 of 24 positions shown; findings below may reference images not displayed]

FINDINGS: Thin liquid- Slightly prominent cricopharyngeus muscle. With
multiple swallows, there was 1 episode of trace laryngeal
penetration without aspiration.

Nectar thick liquid- within normal limits. Slightly prominent
cricopharyngeus muscle.

Fonte?[place] within normal limits. Slightly prominent cricopharyngeus
muscle.

Fonte?[place] with cracker- within normal limits. Slightly prominent
cricopharyngeus muscle.
IMPRESSION: Slightly prominent cricopharyngeus muscle.

With multiple swallows of thin liquid, there was one episode of
trace laryngeal penetration without aspiration.

Please refer to the Speech Pathologists report for complete details
and recommendations.

## 2023-09-05 DEATH — deceased
